# Patient Record
Sex: Male | Born: 1970 | Race: White | Hispanic: No | Marital: Married | Smoking: Never smoker
Health system: Southern US, Community
[De-identification: ages and names within clinical notes are randomized; demographics above are authoritative.]

## PROBLEM LIST (undated history)

## (undated) DIAGNOSIS — Z973 Presence of spectacles and contact lenses: Secondary | ICD-10-CM

## (undated) DIAGNOSIS — I4891 Unspecified atrial fibrillation: Secondary | ICD-10-CM

## (undated) DIAGNOSIS — Z95810 Presence of automatic (implantable) cardiac defibrillator: Secondary | ICD-10-CM

## (undated) DIAGNOSIS — I422 Other hypertrophic cardiomyopathy: Secondary | ICD-10-CM

## (undated) HISTORY — DX: Unspecified atrial fibrillation: I48.91

## (undated) HISTORY — DX: Other hypertrophic cardiomyopathy: I42.2

---

## 2006-04-08 ENCOUNTER — Ambulatory Visit: Payer: Self-pay | Admitting: Cardiology

## 2006-04-08 ENCOUNTER — Ambulatory Visit: Payer: Self-pay

## 2006-04-08 ENCOUNTER — Encounter: Payer: Self-pay | Admitting: Cardiology

## 2006-04-26 ENCOUNTER — Ambulatory Visit: Payer: Self-pay | Admitting: Cardiology

## 2006-05-02 ENCOUNTER — Ambulatory Visit (HOSPITAL_COMMUNITY): Admission: RE | Admit: 2006-05-02 | Discharge: 2006-05-02 | Payer: Self-pay | Admitting: Cardiology

## 2006-05-02 ENCOUNTER — Ambulatory Visit: Payer: Self-pay | Admitting: Internal Medicine

## 2006-05-08 ENCOUNTER — Ambulatory Visit: Payer: Self-pay | Admitting: Cardiology

## 2006-06-20 ENCOUNTER — Ambulatory Visit: Payer: Self-pay | Admitting: Orthopedic Surgery

## 2006-07-14 ENCOUNTER — Ambulatory Visit: Payer: Self-pay | Admitting: Orthopedic Surgery

## 2006-07-19 ENCOUNTER — Ambulatory Visit (HOSPITAL_COMMUNITY): Admission: RE | Admit: 2006-07-19 | Discharge: 2006-07-19 | Payer: Self-pay | Admitting: Orthopedic Surgery

## 2006-07-22 ENCOUNTER — Ambulatory Visit: Payer: Self-pay | Admitting: Orthopedic Surgery

## 2006-07-23 ENCOUNTER — Encounter (HOSPITAL_COMMUNITY): Admission: RE | Admit: 2006-07-23 | Discharge: 2006-08-22 | Payer: Self-pay | Admitting: Orthopedic Surgery

## 2006-08-05 ENCOUNTER — Ambulatory Visit: Payer: Self-pay | Admitting: Orthopedic Surgery

## 2006-12-06 ENCOUNTER — Ambulatory Visit (HOSPITAL_COMMUNITY): Admission: RE | Admit: 2006-12-06 | Discharge: 2006-12-06 | Payer: Self-pay | Admitting: Family Medicine

## 2007-01-29 ENCOUNTER — Ambulatory Visit: Payer: Self-pay | Admitting: Cardiology

## 2007-04-08 ENCOUNTER — Ambulatory Visit: Payer: Self-pay | Admitting: Cardiology

## 2007-10-29 IMAGING — CR DG CHEST 2V
2 series · 2 of 2 positions shown · non-contrast
Comparison: None.

Exam: Chest, 2 views.

HISTORY: Bronchitis. Febrile illness. Cough. Fever.

[view not recorded (1 of 2)]
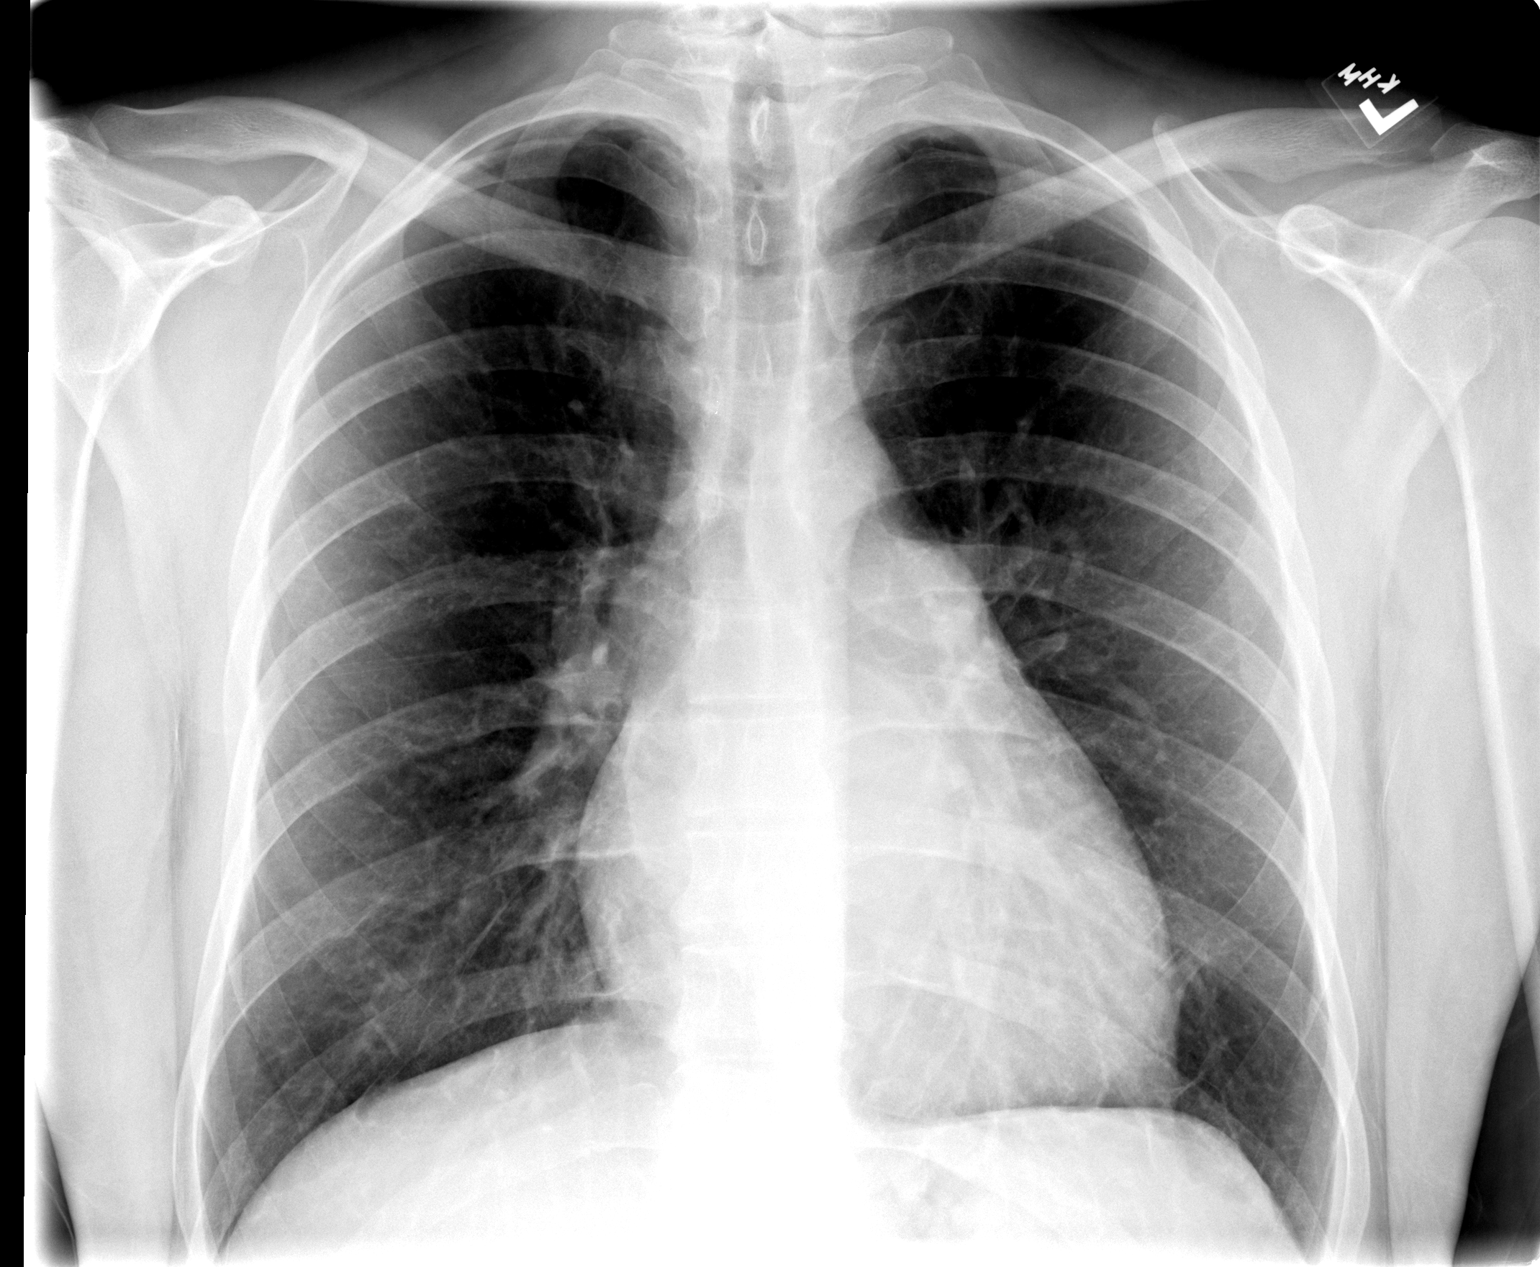

[view not recorded (2 of 2)]
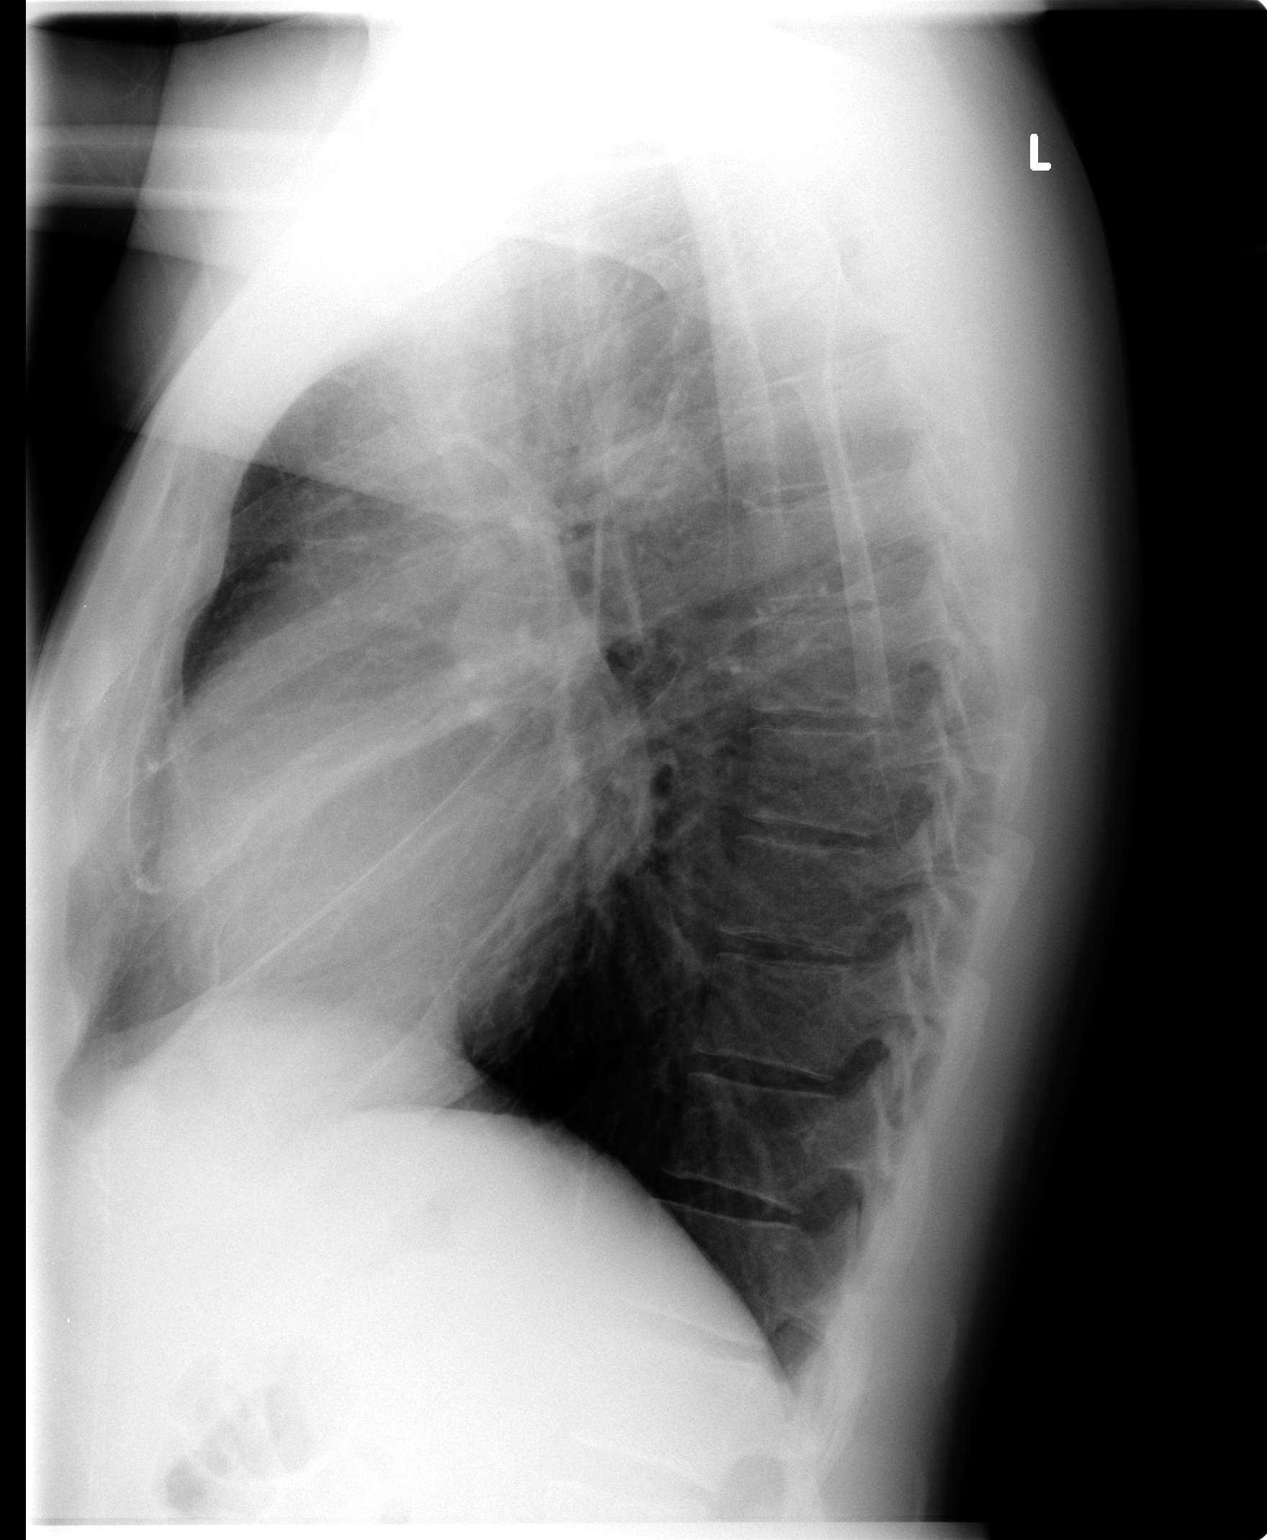

[2 of 2 positions shown; findings below may reference images not displayed]

FINDINGS: Heart size is mildly enlarged.

No pleural effusions or pulmonary edema.

No airspace opacities are identified.

Review of the visualized osseous structures is unremarkable.
IMPRESSION: 1. Mild cardiac enlargement without evidence for congestive heart failure.
2. No evidence for pneumonia.

## 2008-03-03 ENCOUNTER — Ambulatory Visit: Payer: Self-pay

## 2008-03-03 ENCOUNTER — Encounter: Payer: Self-pay | Admitting: Cardiology

## 2008-03-04 ENCOUNTER — Ambulatory Visit: Payer: Self-pay | Admitting: Cardiology

## 2008-06-09 ENCOUNTER — Ambulatory Visit: Payer: Self-pay | Admitting: Cardiology

## 2008-07-07 ENCOUNTER — Ambulatory Visit: Payer: Self-pay | Admitting: Cardiology

## 2008-07-27 ENCOUNTER — Ambulatory Visit: Payer: Self-pay | Admitting: Cardiology

## 2008-07-27 LAB — CONVERTED CEMR LAB
BUN: 23 mg/dL (ref 6–23)
CO2: 28 meq/L (ref 19–32)
Chloride: 108 meq/L (ref 96–112)
Creatinine, Ser: 0.9 mg/dL (ref 0.4–1.5)
Glucose, Bld: 99 mg/dL (ref 70–99)
Pro B Natriuretic peptide (BNP): 1528 pg/mL — ABNORMAL HIGH (ref 0.0–100.0)

## 2008-08-04 ENCOUNTER — Ambulatory Visit: Payer: Self-pay | Admitting: Cardiology

## 2008-08-04 LAB — CONVERTED CEMR LAB
BUN: 22 mg/dL (ref 6–23)
Calcium: 9.7 mg/dL (ref 8.4–10.5)
GFR calc Af Amer: 97 mL/min
Glucose, Bld: 72 mg/dL (ref 70–99)
HCT: 45.1 % (ref 39.0–52.0)
Hemoglobin: 15.2 g/dL (ref 13.0–17.0)
Monocytes Absolute: 0.7 10*3/uL (ref 0.1–1.0)
Monocytes Relative: 9.3 % (ref 3.0–12.0)
Neutro Abs: 5.3 10*3/uL (ref 1.4–7.7)
RDW: 13.1 % (ref 11.5–14.6)
Sodium: 142 meq/L (ref 135–145)

## 2008-08-23 DIAGNOSIS — I5032 Chronic diastolic (congestive) heart failure: Secondary | ICD-10-CM

## 2008-08-23 DIAGNOSIS — I421 Obstructive hypertrophic cardiomyopathy: Secondary | ICD-10-CM | POA: Insufficient documentation

## 2008-08-24 ENCOUNTER — Ambulatory Visit: Payer: Self-pay | Admitting: Cardiology

## 2008-08-24 ENCOUNTER — Ambulatory Visit: Payer: Self-pay

## 2008-08-24 ENCOUNTER — Encounter: Payer: Self-pay | Admitting: Cardiology

## 2008-09-03 HISTORY — PX: CARDIAC SURGERY: SHX584

## 2008-09-20 ENCOUNTER — Ambulatory Visit: Payer: Self-pay | Admitting: Cardiology

## 2008-10-11 ENCOUNTER — Encounter: Payer: Self-pay | Admitting: Cardiology

## 2008-10-28 ENCOUNTER — Ambulatory Visit: Payer: Self-pay | Admitting: Cardiology

## 2008-10-28 ENCOUNTER — Ambulatory Visit: Payer: Self-pay

## 2008-12-13 ENCOUNTER — Encounter: Payer: Self-pay | Admitting: Cardiology

## 2008-12-14 ENCOUNTER — Encounter: Payer: Self-pay | Admitting: Cardiology

## 2008-12-15 ENCOUNTER — Encounter: Payer: Self-pay | Admitting: Cardiology

## 2008-12-21 ENCOUNTER — Encounter: Payer: Self-pay | Admitting: Cardiology

## 2008-12-27 ENCOUNTER — Telehealth: Payer: Self-pay | Admitting: Cardiology

## 2008-12-28 DIAGNOSIS — J45909 Unspecified asthma, uncomplicated: Secondary | ICD-10-CM | POA: Insufficient documentation

## 2008-12-29 ENCOUNTER — Ambulatory Visit: Payer: Self-pay | Admitting: Cardiology

## 2008-12-29 DIAGNOSIS — I4891 Unspecified atrial fibrillation: Secondary | ICD-10-CM

## 2008-12-30 ENCOUNTER — Encounter: Payer: Self-pay | Admitting: Cardiology

## 2009-01-03 ENCOUNTER — Encounter (HOSPITAL_COMMUNITY): Admission: RE | Admit: 2009-01-03 | Discharge: 2009-02-02 | Payer: Self-pay | Admitting: Internal Medicine

## 2009-01-04 ENCOUNTER — Telehealth: Payer: Self-pay | Admitting: Cardiology

## 2009-01-05 ENCOUNTER — Telehealth: Payer: Self-pay | Admitting: Cardiology

## 2009-01-05 ENCOUNTER — Encounter: Payer: Self-pay | Admitting: Cardiology

## 2009-01-07 ENCOUNTER — Encounter: Payer: Self-pay | Admitting: Cardiology

## 2009-01-10 LAB — CONVERTED CEMR LAB
CO2: 23 meq/L (ref 19–32)
Chloride: 100 meq/L (ref 96–112)
Potassium: 4.7 meq/L (ref 3.5–5.3)
Sodium: 137 meq/L (ref 135–145)

## 2009-01-17 ENCOUNTER — Ambulatory Visit: Payer: Self-pay

## 2009-01-17 ENCOUNTER — Encounter: Payer: Self-pay | Admitting: Cardiology

## 2009-01-18 ENCOUNTER — Ambulatory Visit: Payer: Self-pay | Admitting: Cardiology

## 2009-02-02 ENCOUNTER — Encounter (HOSPITAL_COMMUNITY): Admission: RE | Admit: 2009-02-02 | Discharge: 2009-02-28 | Payer: Self-pay | Admitting: Cardiology

## 2009-02-09 ENCOUNTER — Encounter: Payer: Self-pay | Admitting: Cardiology

## 2009-02-28 ENCOUNTER — Encounter: Payer: Self-pay | Admitting: Cardiology

## 2009-03-03 ENCOUNTER — Encounter: Payer: Self-pay | Admitting: Cardiology

## 2009-03-03 ENCOUNTER — Ambulatory Visit: Payer: Self-pay

## 2009-03-03 ENCOUNTER — Ambulatory Visit: Payer: Self-pay | Admitting: Cardiology

## 2009-03-04 ENCOUNTER — Encounter (HOSPITAL_COMMUNITY): Admission: RE | Admit: 2009-03-04 | Discharge: 2009-04-03 | Payer: Self-pay | Admitting: Cardiology

## 2009-04-04 ENCOUNTER — Encounter (HOSPITAL_COMMUNITY): Admission: RE | Admit: 2009-04-04 | Discharge: 2009-05-04 | Payer: Self-pay | Admitting: Cardiology

## 2009-05-05 ENCOUNTER — Ambulatory Visit: Payer: Self-pay | Admitting: Cardiology

## 2009-05-12 ENCOUNTER — Ambulatory Visit: Payer: Self-pay

## 2009-06-02 ENCOUNTER — Encounter: Payer: Self-pay | Admitting: Cardiology

## 2009-11-02 ENCOUNTER — Ambulatory Visit: Payer: Self-pay | Admitting: Cardiology

## 2009-12-05 ENCOUNTER — Ambulatory Visit: Payer: Self-pay

## 2009-12-05 ENCOUNTER — Ambulatory Visit: Payer: Self-pay | Admitting: Internal Medicine

## 2009-12-05 ENCOUNTER — Ambulatory Visit (HOSPITAL_COMMUNITY): Admission: RE | Admit: 2009-12-05 | Discharge: 2009-12-05 | Payer: Self-pay | Admitting: Cardiology

## 2009-12-05 ENCOUNTER — Encounter: Payer: Self-pay | Admitting: Cardiology

## 2010-05-09 ENCOUNTER — Ambulatory Visit: Payer: Self-pay | Admitting: Cardiology

## 2010-10-01 LAB — CONVERTED CEMR LAB
Calcium: 10.5 mg/dL (ref 8.4–10.5)
MCHC: 32.3 g/dL (ref 30.0–36.0)
MCV: 94.8 fL (ref 78.0–100.0)
Platelets: 493 10*3/uL — ABNORMAL HIGH (ref 150–400)
RDW: 13.9 % (ref 11.5–15.5)
Sodium: 142 meq/L (ref 135–145)
WBC: 11.3 10*3/uL — ABNORMAL HIGH (ref 4.0–10.5)

## 2010-10-04 NOTE — Assessment & Plan Note (Signed)
Summary: 6 month rov.sl   Visit Type:  Follow-up Primary Win Guajardo:  Lubertha South  CC:  no complaints.  History of Present Illness: The patient is 40 years old and return for management of hypertrophic cardiomyopathy. About a year and a half ago he developed progressive symptoms from his hypertrophic cardiomyopathy with diastolic heart failure. In April of 2010 he underwent a myectomy at the [facility] with the reduction in his subvalvular gradient from 90 mm to 0 mm. He had a stress test following his myectomy procedure here in our office and had no drop in his blood pressure and no ventricular tachycardia. He did have some second degree AV block. He has left bundle branch block on his ECG since his myectomy.  He has done well recently and has had no chest pain shortness of breath or swelling or palpitations. He's gotten into a regular exercise program after school. He teaches chemistry in high school. He has gained some weight over the past several months despite his exercise.  Current Medications (verified): 1)  Aspirin 81 Mg Tbec (Aspirin) .... Take One Tablet By Mouth Daily 2)  Carvedilol 6.25 Mg Tabs (Carvedilol) .... Take Two Tablets in The Morning and One Tablet in The Evening.  Allergies (verified): 1)  ! * Latex  Past History:  Past Medical History: Reviewed history from 08/23/2008 and no changes required.  1. 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 2. Class II to III heart failure. 3. History of asthma.   Review of Systems       ROS is negative except as outlined in HPI.   Vital Signs:  Patient profile:   40 year old male Height:      79 inches Weight:      218 pounds BMI:     24.65 Pulse rate:   59 / minute BP sitting:   119 / 78  (left arm) Cuff size:   large  Vitals Entered By: Burnett Kanaris, CNA (May 09, 2010 4:31  PM)  Physical Exam  Additional Exam:  Gen. Well-nourished, in no distress   Neck: No JVD, thyroid not enlarged, no carotid bruits Lungs: No tachypnea, clear without rales, rhonchi or wheezes Cardiovascular: Rhythm regular, PMI not displaced,  heart sounds  normal, grade 2/6 systolic ejection murmur at the left sternal edge, no peripheral edema, pulses normal in all 4 extremities. Abdomen: BS normal, abdomen soft and non-tender without masses or organomegaly, no hepatosplenomegaly. 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 He appears euvolemic and his exercise tolerance appears to be very good. This problem appears stable. We will continue his current treatment. We will get a echocardiogram before his followup visit with Dr. Gala Romney in March of 2012.  His updated medication list for this problem includes:    Aspirin 81 Mg Tbec (Aspirin) .Marland Kitchen... Take one tablet by mouth daily    Carvedilol 6.25 Mg Tabs (Carvedilol) .Marland Kitchen... Take two tablets in the morning and one tablet in the evening.  Orders: Echocardiogram (Echo)  Problem # 2:  DIASTOLIC HEART FAILURE, CHRONIC (ICD-428.32) He appears euvolemic. He has had no signs of CHF since his myectomy.  His updated medication list for this problem includes:    Aspirin 81 Mg Tbec (Aspirin) .Marland Kitchen... Take one tablet by mouth daily    Carvedilol 6.25 Mg Tabs (Carvedilol) .Marland Kitchen... Take two tablets in the morning and one tablet in the evening.  Orders: Echocardiogram (Echo)  Problem # 3:  ATRIAL FIBRILLATION (ICD-427.31) He had atrial fibrillation in the early postoperative period but has none since. This problem appears to have resolved. His updated medication list for this problem includes:    Aspirin 81 Mg Tbec (Aspirin) .Marland Kitchen... Take one tablet by mouth daily    Carvedilol 6.25  Mg Tabs (Carvedilol) .Marland Kitchen... Take two tablets in the morning and one tablet in the evening.  Patient Instructions: 1)  Your physician wants you to follow-up in: March 2012 with Dr Nicholes Mango.   You will receive a reminder letter in the mail two months in advance. If you don't receive a letter, please call our office to schedule the follow-up appointment. 2)  Your physician has requested that you have an echocardiogram.  Echocardiography is a painless test that uses sound waves to create images of your heart. It provides your doctor with information about the size and shape of your heart and how well your heart's chambers and valves are working.  This procedure takes approximately one hour. There are no restrictions for this procedure. March  2012 a few days before the appointment with Dr Gala Romney.

## 2010-10-04 NOTE — Miscellaneous (Signed)
  Clinical Lists Changes  Observations: Added new observation of ECHOINTERP:  Study Conclusions            - Left ventricle: Small jet of continuous flow on LV side of       interventricular septum consistent with small intracavity fistula.       Present in echo from 2010. The cavity size was normal. Wall       thickness was increased in a pattern of moderate LVH. Systolic       function was vigorous. The estimated ejection fraction was in the       range of 65% to 70%. Doppler parameters are consistent with       abnormal left ventricular relaxation (grade 1 diastolic       dysfunction).     - Left atrium: The atrium was severely dilated.     - Right atrium: The atrium was moderately to severely dilated.     - Pulmonary arteries: PA peak pressure: 36mm Hg (S).     - Pericardium, extracardiac: A trivial pericardial effusion was       identified. (12/05/2009 9:29)      Echocardiogram  Procedure date:  12/05/2009  Findings:       Study Conclusions            - Left ventricle: Small jet of continuous flow on LV side of       interventricular septum consistent with small intracavity fistula.       Present in echo from 2010. The cavity size was normal. Wall       thickness was increased in a pattern of moderate LVH. Systolic       function was vigorous. The estimated ejection fraction was in the       range of 65% to 70%. Doppler parameters are consistent with       abnormal left ventricular relaxation (grade 1 diastolic       dysfunction).     - Left atrium: The atrium was severely dilated.     - Right atrium: The atrium was moderately to severely dilated.     - Pulmonary arteries: PA peak pressure: 36mm Hg (S).     - Pericardium, extracardiac: A trivial pericardial effusion was       identified.

## 2010-10-04 NOTE — Assessment & Plan Note (Signed)
Summary: f60m   Visit Type:  Follow-up Primary Provider:  Lubertha South  CC:  no complaints.  History of Present Illness: Patient is 40 years old and returns for management of hypertrophic cardiomyopathy. He has a long history of familial hypertrophic cardio myopathy. He underwent myectomy at the [facility] in April 2010. His valvular gradient went from 90 mm to 10 mm postoperatively. We did a treadmill test a few months after his surgery and he had no hypotension and no ventricular tachycardia. He did develop some AV block with exercise.  He has done well over the past number of months. He's been working out 5 days a week. 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 He does say that when he exercises he gets his heart rate up from 60-100 he does quite well. If he pushes himself further his heart rate goes up to 120s and he feels bad and had to stop. He's had no chest pain.  Current Medications (verified): 1)  Aspirin 81 Mg Tbec (Aspirin) .... Take One Tablet By Mouth Daily 2)  Carvedilol 6.25 Mg Tabs (Carvedilol) .... Take One Tablet By Mouth Twice A Day  Allergies (verified): 1)  ! * Latex  Past History:  Past Medical History: Reviewed history from 08/23/2008 and no changes required.  1. 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 2. Class II to III heart failure. 3. History of asthma.   Review of Systems       ROS is negative except as outlined in HPI.   Vital Signs:  Patient profile:   40 year old male Height:      79 inches Weight:      218 pounds BMI:     24.65 Pulse rate:   67 / minute BP sitting:   129 / 76  (left arm) Cuff size:   large  Vitals Entered By: Burnett Kanaris, CNA (November 02, 2009 4:31 PM)  Physical Exam  Additional Exam:  Gen. Well-nourished, in no distress   Neck: No JVD, thyroid not enlarged, no carotid  bruits Lungs: No tachypnea, clear without rales, rhonchi or wheezes Cardiovascular: Rhythm regular, PMI not displaced,  heart sounds  normal, grade 2/6 short systolic murmur at the left sternal edge, no peripheral edema, pulses normal in all 4 extremities. Abdomen: BS normal, abdomen soft and non-tender without masses or organomegaly, no hepatosplenomegaly. 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 He's done quite well since that time. He says that when he pushes his heart rate over 100 he starts to feel bad. We decided to increase his carvedilol from 6.25 b.i.d. to 2 tablets in the morning and one tablet in the afternoon. If his symptoms on the treadmill or not better he can increase this further to 2 tablets in the morning and 2 tablets in the evening as long as his resting pulse rate is over 55. We will also repeat his echocardiogram to see his response to surgery over the past year.   His updated medication list for this problem includes:    Aspirin 81 Mg Tbec (Aspirin) .Marland Kitchen... Take one  tablet by mouth daily    Carvedilol 6.25 Mg Tabs (Carvedilol) .Marland Kitchen... Take two tablets in the morning and one tablet in the evening.  Orders: Echocardiogram (Echo)  Problem # 2:  DIASTOLIC HEART FAILURE, CHRONIC (ICD-428.32) He appears compensated today and euvolemic. We will make the adjustments as outlined above. His updated medication list for this problem includes:    Aspirin 81 Mg Tbec (Aspirin) .Marland Kitchen... Take one tablet by mouth daily    Carvedilol 6.25 Mg Tabs (Carvedilol) .Marland Kitchen... Take two tablets in the morning and one tablet in the evening.  Problem # 3:  ASTHMA, UNSPECIFIED, UNSPECIFIED STATUS (ICD-493.90) This has been quiet and is managed by his primary care physician.  Patient  Instructions: 1)  Your physician has recommended you make the following change in your medication: 1) increase coreg 6.25mg  to two tabs in the morning and one tab in the evening. 2)  If your symptoms do not get any better, you may increase your coreg to 6.25mg  two tabs two times a day, if your resting heart rate is 55 beats per minute or above. 3)  Your physician has requested that you have an echocardiogram in 1 month.  Echocardiography is a painless test that uses sound waves to create images of your heart. It provides your doctor with information about the size and shape of your heart and how well your heart's chambers and valves are working.  This procedure takes approximately one hour. There are no restrictions for this procedure. 4)  Your physician wants you to follow-up in: 6 months.  You will receive a reminder letter in the mail two months in advance. If you don't receive a letter, please call our office to schedule the follow-up appointment. Prescriptions: CARVEDILOL 6.25 MG TABS (CARVEDILOL) Take two tablets in the morning and one tablet in the evening.  #90 x 6   Entered by:   Sherri Rad, RN, BSN   Authorized by:   Lenoria Farrier, MD, [facility]   Signed by:   Sherri Rad, RN, BSN on 11/02/2009   Method used:   Print then Give to Patient   RxID:   [id number]

## 2011-01-16 NOTE — Assessment & Plan Note (Signed)
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  His dad also has  hypertrophic cardiomyopathy.  We have evaluated him with an  echocardiogram which was performed in August, 2007 and showed systolic  anterior motion of the mitral valve with a peak outflow gradient of 117  mmHg and mid cavity obliteration.  His septal thickness was 17 mm, and  his posterior wall thickness was 16 mm.  He had a CPX test which showed  reduced exercise tolerance and reduced CO2 max, which was 66% of the  sedentary value predicted for his age.   When I saw him last time, we had put him on Cardizem.  We also  recommended regular exercise.  His wife brought him a treadmill, and he  was doing regular exercise for a while, but he started getting involved  in yard work and backed off this for a little bit.  For a period of  time, he was off his Cardizem.  He is back on it now.   He still has limited exercise tolerance.  He gets short of breath fairly  easily.   The other symptom he has is cough.  This occurs usually at night and is  slightly productive.  He attributed this to his cardiomyopathy.  He has  had no symptoms of reflux associated with this and no wheezing  associated with it.   His past medical history is significant for asthma and allergies.   His current medications include Cardizem CD 120.   PHYSICAL EXAMINATION:  VITAL SIGNS:  The blood pressure is 110/72.  The  pulse is 88 and regular.  There is no venous distention.  The carotid pulses were full without  bruits.  CHEST:  Clear.  CARDIAC:  Rhythm was regular.  There was a grade 3/6 harsh systolic  murmur at the left sternal edge  radiating to the base.  I could hear no  diastolic murmur.  ABDOMEN:  Soft without organomegaly.  EXTREMITIES:  Peripheral pulses were full.  There was no peripheral  edema.   IMPRESSION:  1. Hypertrophic cardiomyopathy.  2. Congestive heart failure, class II-III, euvolemic.  3. History of Mononucleosis.  4. History of asthma.   RECOMMENDATIONS:  Dajaun still has significant symptoms, some of which I  think are related to his cardiomyopathy.  I encouraged him to continue  doing regular exercise on the treadmill.  We will plan to increase his  Cardizem from 120 to 180 mg.  I doubt his cough is from his  cardiomyopathy and suspect it may be related to allergies and asthma or  possibly reflux with asthma.  If he is not better on a higher dose of  Cardizem, then he is to call us and we will consider pulmonary  consultation.  I will plan to see  him back in three months.  I told him  once he is in a more regular exercise program and once he is on a higher  dose of Cardizem, we will want to consider redoing a standard stress  test as well as repeating his echo.     Bruce Elvera Lennox Juanda Chance, MD, [facility]  Electronically Signed    BRB/MedQ  DD: 04/08/2007  DT: 04/08/2007  Job #: [id number]

## 2011-01-16 NOTE — Assessment & Plan Note (Signed)
[facility]                            CARDIOLOGY OFFICE NOTE   ERIKSON, DANZY                     MRN:          [id number]  DATE:03/04/2008                            DOB:          06/29/1971    PRIMARY CARE PHYSICIAN:  None.   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  He has a strong family history of  hypertrophic cardiomyopathy.  His dad was a patient of mine and died of  his myopathy.  He has a cousin who has had myectomy and has an ICD.   Casey Holland says he has become more symptomatic over the last several months.  He says he gives out very easily with minimal symptoms.  He also has  some mild chest pain with activity.  He says these symptoms are also  related to after eating a heavy meal.   We did an echo on him prior to this visit and his gradient was 80  compared to 117 on his previous echo.  His posterior wall and septal  thickness were 15 and 17, which were not much different than before.   PAST MEDICAL HISTORY:  Significant for asthma and allergies.   CURRENT MEDICATIONS:  Cardizem CD 180, but he indicated that he has not  been taking this regularly until the last few weeks.   He had an inversion to medicines for some time, but he says his attitude  has changed now.   PHYSICAL EXAMINATION:  VITAL SIGNS:  Blood pressure 118/82 and pulse 91  and regular.  NECK:  There was no venous distension.  The carotid pulses were full  without bruits.  CHEST:  Clear.  CARDIAC:  Rhythm is regular with grade 3/6 harsh systolic murmur at the  left sternal edge radiating to the base.  There was a short diastolic  murmur heard at the apex.  ABDOMEN:  Soft.  Normal bowel sounds.  There is no peripheral edema.   IMPRESSION:  1. Familial hypertrophic cardiomyopathy.  2. Congestive heart failure class II-III, euvolemic.  3.  History of asthma.   RECOMMENDATIONS:  Cadan is still quite symptomatic from his hypertrophic  cardiomyopathy.  He has not really been given an adequate trial of  medications, but he says he is willing to take his medications regularly  now.  We will increase his Cardizem from 180-240.  I will see him back  in about 3 months.  Beta-blockers could be somewhat problematic in him  because of his asthma, but we could probably use beta-blockers, at least  at lower doses.  He has read on the  Internet and is aware of lot of the implications of IHSS and the  possibilities of treatment with myectomy or septal ablation and  sometimes need for pacemaker and ICD.     Bruce Elvera Lennox Juanda Chance, MD, [facility]  Electronically Signed    BRB/MedQ  DD: 03/04/2008  DT: 03/05/2008  Job #: [id number]

## 2011-01-16 NOTE — Assessment & Plan Note (Signed)
[facility]                            CARDIOLOGY OFFICE NOTE   Casey Holland, Casey Holland                     MRN:          [id number]  DATE:07/07/2008                            DOB:          1970-11-16    The patient was seen in [facility] on July 07, 2008, for Dr.  Clifton James.   PRIMARY CARDIOLOGIST:  Everardo Beals. Juanda Chance, MD, [facility]   This is a 40 year old married white male patient of Dr. Charlies Constable who  has a history of a hypertrophic cardiomyopathy.  He saw Dr. Juanda Chance  earlier this month with complaints of exertional shortness of breath  with minimal activity and palpitations.  The patient had not been  completely compliant with his medicines and Dr. Juanda Chance urged him to take  his Cardizem daily and added low-dose beta-blocker 50 mg daily.  He is  here today for followup.  The patient states that he still becomes short  of breath and heart races if he does very minimal activity.  He states  he has gotten quite depressed over the past month since options were  given to him for possible myectomy in the future.  He does not know if  the depression is from the beta-blocker or from trying to cope with his  diagnosis.  The patient states he also gets quite dizzy when he gets up  quickly, but he has been able to manage this by rising slowly.  Dr.  Juanda Chance was hoping to get his baseline heart rate down from 50-60 range  and hopes of controlling his symptoms better.   CURRENT MEDICATIONS:  1. Cardizem CD 240 mg daily.  2. Toprol-XL 50 mg daily.   PHYSICAL EXAMINATION:  GENERAL:  This is a pleasant, but anxious 37-year-  old white male in no acute distress.  VITAL SIGNS:  Blood pressure 104/72 and pulse 68.  We did do orthostatic  blood pressures and he dropped mostly from lying to sitting from 115  down to 107.  Pulse remained in the 60s.  He was feeling slightly dizzy  when he rose.  NECK:  Without JVD, HJR bruit, or thyroid enlargement.  LUNGS:   Clear; anterior, posterior, and lateral.  HEART:  Regular rate and rhythm at 68 beats per minute.  Normal S1-S2  with a 2-3/6 systolic murmur at the left sternal border and apex.  ABDOMEN:  Soft without organomegaly, masses, lesions, or abnormal  tenderness.  EXTREMITIES:  Without cyanosis or clubbing edema.  He has good distal  pulses.   IMPRESSION:  1. Familial hypertrophic cardiomyopathy with documented subaortic      valve gradient of 80 mmHg by echocardiography.  2. Class II-III heart failure.  3. History of asthma.   PLAN:  We will increase his Toprol to 50 mg in the morning, 25 in the  evening, and keep his Cardizem the same.  I told him that he is to call  us if he does not tolerate these changes.  He  already has an appointment to see Dr. Juanda Chance back in December, at which  time, he will decide whether or  not a regular stress test or CPX testing  is deemed necessary.      Jacolyn Reedy, PA-C  Electronically Signed      Verne Carrow, MD  Electronically Signed   ML/MedQ  DD: 07/07/2008  DT: 07/07/2008  Job #: [id number]

## 2011-01-16 NOTE — Assessment & Plan Note (Signed)
[facility] HEALTHCARE                            CARDIOLOGY OFFICE NOTE   COULTER, OLDAKER                     MRN:          [id number]  DATE:01/29/2007                            DOB:          08-28-1971    PRIMARY CARE PHYSICIAN:  Casey Holland, M.D.   CLINICAL HISTORY:  Casey Holland has hypertrophic cardiomyopathy and  returns for a followup today.  I had seen him last fall, and we had done  a CPX test on him and a recent echo.  CPX test showed reduced VO2 max  which was 25 ml/kg/minute which was 66% of predicted age (sedentary).  He also had an echocardiogram which showed a peak gradient of  and septal and posterior wall thickness of 16 mm.   We had started him on Cardizem at that time, but he stopped it thinking  he had some side effects from wheezing.  He subsequently developed  mononucleosis and had fevers and weakness and was treated by Dr. Gerda Holland.  He is now about over that, and he started back on his Cardizem about a  month ago.  Dr. Gerda Holland did a BNP level which was about 500, and arranged  for him to come back here for a followup.  He had been previously  scheduled to come back earlier than this.   He has 2 children who are 18 months and 4 years.  His wife was with him  today.  He has not been very active yet.  He does activities around the  house and occasionally mows the lawn, now, but he has not done any  regular physical activity.  They did just buy a treadmill so he is  planning to do from this point forward.   PAST MEDICAL HISTORY:  Significant for asthma and allergies.   CURRENT MEDICATIONS:  Cardizem CD 120 mg daily.   PHYSICAL EXAMINATION:  On examination, the blood pressure is 113/75 and  pulse is 72 and regular.  There was no venous distension.  The carotid pulses were full without  bruits.  Chest was clear.  Cardiac rhythm was regular.  There was a harsh grade 3/6 systolic  ejection murmur at the left sternal  edge radiating to the base.  There  was 3rd sound in early diastolic.  I could hear no diastolic murmur.  The abdomen was soft with normal bowel sounds.  There is no  hepatosplenomegaly.  Peripheral pulses were full and there was no peripheral edema.   IMPRESSION:  1. Hypertrophic cardiomyopathy with decreased exercise tolerance.  2. Recent mononucleosis.  3. History of asthma and allergies.   RECOMMENDATIONS:  Casey Holland's exercise tolerance is reduced, and it is  difficult to know how much of this is related to his recent  mononucleosis and deconditioning, and how much is related to his cardiac  problem.  I encouraged him to gradually get involved in an exercise  program to try and improve his conditioning.  I recommend that he do 30  minutes of aerobic activity a day, whether it is walking, walking on the  treadmill or biking.  Will  continue his Cardizem at the same dose.  Will  plan to see him back in 3 months.  At that time, we will consider  adjusting his Cardizem dose and decide about further changes.  At some  Point, we will want to put him back on the treadmill to see what his  exercise tolerance is and if it is improved on the Cardizem.  Beta  blockers are also a problem in the future.  Casey Holland has quite a bit of  concern, both about medications and about the surgery, but he seems  comfortable with the use of Cardizem at the present.     Casey Elvera Lennox Juanda Chance, MD, [facility]  Electronically Signed    BRB/MedQ  DD: 01/29/2007  DT: 01/29/2007  Job #: [id number]   cc:   Casey Holland, M.D.

## 2011-01-16 NOTE — Letter (Signed)
September 20, 2008    Dr. Wilfred Lacy  [facility]  [address] [place], [place] [address]   RE:  QUINDARRIUS, JOPLIN  MRN:  [id number]  /  DOB:  12/12/1970   Dear Cyndra Numbers,   I would very much appreciate your seeing Mr. Osbaldo Mark in  consultation for evaluation and management of his hypertrophic  cardiomyopathy.  Mr. Corbo is 40 years old and has a long history of  familial hypertrophic cardiomyopathy.  Over the last year, he has become  increasingly symptomatic with shortness of breath with exertion and  symptoms of dizziness.  He developed some volume overload/congestive  heart failure and has required treatment with Lasix.  He is currently on  both beta-blocker and calcium channel blocker, but his symptoms did not  really improve much.  He gets short of breath with rather minimal  activity such as walking up stairs or herding on a level ground.   His past medical history is significant for history of asthma.   His current medications include Cardizem CD 300 mg daily, Coreg 12.5 mg  b.i.d., and Lasix 20 mg daily.   His family history is markedly positive for hypertrophic cardiomyopathy.  His father had hypertrophic cardiomyopathy and had valve surgery at [facility]  and died several years ago.  His father's sister's son who is Mr.  Broz cousin had a myectomy at your institution about 8 years ago.  His name is Dione Booze.  Ms. Autumn Messing son also has hypertrophic  cardiomyopathy.  Mr. Vosler also indicated that his grandmother on  his father's side also had hypertrophic cardiomyopathy.   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  He  does not smoke.   PHYSICAL EXAMINATION:  On his examination today, the blood pressure was  110/72 and pulse 67 and regular.  He had some dizziness, but there was  only a slight drop in his blood pressure down to 98 systolic with  standing.  The venous pulsation was visible just at the clavicle.  The  carotid pulses were brisk.  The chest was clear without rales, rhonchi,  or wheezes.  The cardiac rhythm was regular.  There is a harsh 2-3/6  systolic murmur at the left sternal edge radiating to the base.  I could  hear no diastolic murmur.  The abdomen was soft with normal bowel  sounds.  There was no hepatosplenomegaly.  There was trace peripheral  edema and the pedal pulses were equal.   His electrocardiograms have shown left axis deviation in a pseudo  inferior infarct pattern and increased voltage.  His last  echocardiogram, which was performed on August 24, 2008, showed a peak  velocity of 5.5 meters per second with a peak gradient of 104 mmHg.  The  septum and posterior wall thickness were both 19 nanometers.  There was  mild-to-moderate mitral regurgitation, and there was moderate-to-  markedly dilated left atrium.  Remus also had a CTX exercise test  performed and we will get those results to you.   In summary, Wymon has hypertrophic cardiomyopathy with severe left  ventricular outflow tract obstruction and symptoms of class III heart  failure that had been refractory to medical therapy.   We certainly appreciate your evaluation and feel you should have  consideration for a surgical myectomy.   I will be in touch with you by e-mail to help arrange the consultation.    Sincerely,      Bruce R. Juanda Chance,  MD, [facility]  Electronically Signed    BRB/MedQ  DD: 09/20/2008  DT: 09/21/2008  Job #: [id number]

## 2011-01-16 NOTE — Assessment & Plan Note (Signed)
[facility] HEALTHCARE                            CARDIOLOGY OFFICE NOTE   SHYHIEM, BEENEY                     MRN:          [id number]  DATE:08/24/2008                            DOB:          April 21, 1971    CLINICAL HISTORY:  Jazziel returned for followup management of his  hypertrophic cardiomyopathy.  He has a long history of hypertrophic  cardiomyopathy and has been more symptomatic recently with symptoms of  shortness of breath with exertion.  We have been trying to titrate his  beta blockers and calcium-channel blockers, but he seem to get worse on  the beta-blockers, and he was seen in the office by Hillis Range with  increasing edema and thought to have a congestive heart failure.  He  started him on Lasix and this has improved his symptoms some, but he  still has shortness of breath with minimal exertion.   PAST MEDICAL HISTORY:  Significant for asthma.   CURRENT MEDICATIONS:  Cardizem 300 mg daily and Coreg 12.5 mg b.i.d.   On examination, the blood pressure is 100/67, the pulse 64 and regular.  There is no venous distention.  Carotid pulses were full without bruits.  Carotid pulses were brisk without bruits.  The chest was clear.  Heart  rhythm was regular.  There is a harsh 3/6 systolic murmur at the left  sternal edge.  I could hear no diastolic murmur.  The abdomen is soft  with normal bowel sounds.  Peripheral pulses were full with no  peripheral edema.   IMPRESSION:  1. Familial hypertrophic cardiomyopathy with subaortic valve gradient      of 80-100 mmHg.  2. Class III congestive heart failure secondary to familial      hypertrophic cardiomyopathy with subaortic valve gradient of 80-100      mmHg.  3. History of asthma.   RECOMMENDATIONS:  We had a great deal of difficulty titrating choice  medicines due to intolerance.  I am dictating this office note late and  I am not sure if we made an attempt to adjust his medications on the  day  of the office visit.  We did discuss a referral to a center and  discussed the possibilities of [address], [address], and [employer] [facility].  I told him I would do some research to see what we thought  the most appropriate place to go would be.  I think we arranged a  followup visit in about 4 weeks for these discussions.     Bruce Elvera Lennox Juanda Chance, MD, [facility]  Electronically Signed    BRB/MedQ  DD: 09/07/2008  DT: 09/08/2008  Job #: [id number]

## 2011-01-16 NOTE — Assessment & Plan Note (Signed)
[place] [facility]CARE                            CARDIOLOGY OFFICE NOTE   Casey Holland, Casey Holland                     MRN:          [id number]  DATE:07/27/2008                            DOB:          [birth year]    INTRODUCTION:  Mr. Casey Holland is a pleasant 40 year old gentleman with  hypertrophic cardiomyopathy, who presents today for an acute visit due  to worsening heart failure.   PROBLEM LIST:  1. Hypertrophic cardiomyopathy.  2. Chronic New York Heart Association Class II/III diastolic heart      failure.  3. History of asthma.   MEDICATIONS:  1. Cardizem 240 mg daily.  2. Toprol-XL 50 mg q.a.m. and 25 mg q.p.m.   Most recent echocardiogram performed on March 03, 2008, revealed [facility] of  the mitral valve with a left ventricular outflow tract gradient of 80  mmHg.  The left ventricle was small with vigorous contraction, and an  ejection fraction of 75%.  Mid-systolic closure of the aortic valve and  mild mitral regurgitation were observed.  The left atrium was dilated.  The interventricular septum measured 15 mm with posterior wall of 17 mm.   INTERVAL HISTORY:  Casey Holland presents today for followup.  He  reports progressive symptoms of heart failure since initiation of Toprol-  XL.  He reports that prior to initiating this medication, he became  dyspneic with 1 flight of stairs.  Since initiating this medication, he  now becomes dyspneic with approximately 1-1/2 flight of stairs.  He  denies significant chest discomfort, palpitations, presyncope, or  syncope.  He denies neurologic symptoms or other complaints today.  Most  recently, he was evaluated on July 07, 2008, and his Toprol-XL was  increased from 50 to 75 mg daily.  Since that time, he reports profound  worsening of his heart failure symptoms with moderate dyspnea with  minimal activities.  He has also had increasing abdominal girth with  fullness and early satiety.  Over the past 2-3  days, he has noticed  profound lower extremity edema.  He therefore presents today for an  acute visit for further evaluation.   PHYSICAL EXAMINATION:  VITAL SIGNS:  Blood pressure 120/76, heart rate  63, respirations 18, and weight 207 pounds.  GENERAL:  The patient is a well-appearing male in no acute distress.  He  is alert and oriented x3.  HEENT:  Normocephalic and atraumatic.  Sclerae clear.  Conjunctivae  pink.  Oropharynx clear.  NECK:  Supple.  JVP 10 cm.  LUNGS:  Clear to auscultation bilaterally.  HEART:  Regular rate and rhythm.  A 3/6 systolic ejection murmur along  the left sternal border with a 2/6 systolic ejection murmur at the apex.  GI:  Soft, nontender, nondistended.  Positive bowel sounds.  EXTREMITIES:  No clubbing or cyanosis.  A 2+ pitting ankle edema is  noted.  NEUROLOGIC:  Cranial nerves II through XII are intact.  Strength and  sensation are intact.  SKIN:  No ecchymosis or lacerations.  MUSCULOSKELETAL:  No deformity or atrophy.  PSYCHIATRIC:  Euthymic mood.  Full affect.   EKG today  reviewed by me reveals sinus rhythm at 63 beats per minute  with left atrial enlargement and a first-degree AV block measuring 218  msec.  Left ventricular hypertrophy is also noted as well as an inferior  infarct pattern.   SOCIAL HISTORY:  The patient continues to teach high school Chemistry.  He denies any recent increase in salt intake or change in diet.   FAMILY HISTORY:  The patient has a familial hypertrophic cardiomyopathy.   IMPRESSION:  Casey Holland is a 40 year old gentleman, well known to our  practice with familial hypertrophic cardiomyopathy and documented  subaortic valve gradient of 80 mmHg.  He now presents with acute on  chronic diastolic dysfunction, which the patient feels has been  precipitated by recent increases in Toprol.  Therapeutic strategies for  management of Casey Holland heart failure are quite difficult.  Typically, I would expect that  increasing his beta-blocker would have  been productive and would have decreased his heart failure symptoms.  However, he is quite clear that his symptoms have worsened with beta-  blockade.  I will therefore gently reduce his Toprol-XL back to 50 mg  daily.  I have also initiated Lasix 20 mg daily.  The patient is  instructed to continue daily weights and salt  restrictions.  We will  obtain a basic metabolic profile today to evaluate his renal function  and potassium.  He will need close followup with Dr. Juanda Chance.  I suspect  that if the patient's symptoms persist that surgical consultation may be  necessary in the near future.   PLAN:  1. We will obtain a BMP and BNP today.  2. Lasix 20 mg daily.  3. Decreased Toprol-XL to 50 mg daily.  4. The patient is instructed to follow up with Dr. Juanda Chance at his next      available appointment.     Hillis Range, MD  Electronically Signed    JA/MedQ  DD: 07/27/2008  DT: 07/28/2008  Job #: [id number]

## 2011-01-16 NOTE — Assessment & Plan Note (Signed)
[facility]                            CARDIOLOGY OFFICE NOTE   Casey Holland, Casey Holland                     MRN:          [id number]  DATE:08/04/2008                            DOB:          [birth year]    PRIMARY CARE PHYSICIAN:  None.   CLINICAL HISTORY:  Casey Holland returned for followup management of his  hypertrophic cardiomyopathy.  Casey Holland has a long history of cardiomyopathy  and has had increased symptoms of shortness of breath with exertion  recently, and I have been titrating his beta blocker and Cardizem.  He  has a resting gradient of about 80 mmHg and has a septal thickness of 17  mm.  He recently developed increased swelling and shortness of breath  and was seen by Dr. Hillis Range in my absence and felt to be in  congestive heart failure with volume overload for the first time.  He  started him on Lasix 20 mg a day and cut back his Toprol from 75 back to  50 mg.  He has done better since that time.  He feels the fluid is  pretty much cleared out and shortness of breath is some better although  he does get short of breath with exertion.   PAST MEDICAL HISTORY:  Significant for asthma.   CURRENT MEDICATIONS:  1. Cardizem CD 240 mg daily.  2. Toprol 50 mg daily.  3. Lasix 20 mg daily.   PHYSICAL EXAMINATION:  VITAL SIGNS:  Today, blood pressure is 113/79,  pulse 67 and regular.  NECK:  There was no venous distention.  The carotid pulses were full and  brisk.  CHEST:  Clear without rales or rhonchi.  CARDIAC:  Rhythm was regular.  There was grade 2-3/6 systolic ejection  murmur at the left sternal edge.  There is an opening sound at the apex  after S2.  ABDOMEN:  Soft with normal bowel sounds.  There is no  hepatosplenomegaly.  EXTREMITIES:  There was trace peripheral edema.  Pedal pulses were  equal.   IMPRESSION:  1. Familial hypertrophic cardiomyopathy with subaortic valve gradient      of 80 mmHg with recent congestive heart failure  and volume      overload, now improved with diuretics.  2. Class II to III heart failure.  3. History of asthma.   RECOMMENDATIONS:  Zarin is doing some better.  He will plan to continue  his beta blocker and Lasix and will increase his Cardizem from 240-300 a  day in the hope that we can reduce his gradient.  I will arrange for him  to have an echocardiogram before a followup visit in about 3 weeks.  I  discussed the situation with Dr. Graciela Husbands today, and he does not have  criteria for an ICD (3-cm septum, decreased blood pressure with exercise  testing, ventricular tachycardia, or a family history of sudden cardiac  death).  However, with persistent symptoms and congestive heart failure  despite treatment with beta blocker and Cardizem, it may be time to  refer him to [facility] for consideration for either alcohol  septal ablation  or surgical myectomy.  I will discuss this further with  him on his next visit.   We also got a chest x-ray today.     Bruce Elvera Lennox Juanda Chance, MD, [facility]  Electronically Signed    BRB/MedQ  DD: 08/04/2008  DT: 08/05/2008  Job #: [id number]

## 2011-01-16 NOTE — Assessment & Plan Note (Signed)
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, who returned for management of his hypertrophic  cardiomyopathy.  He has been quite symptomatic with shortness of breath  with exertion when he mows his lawn.  Other activities also cause him to  be short of breath with minimal activity.  He has had no chest pain or  palpitations.  In addition to these symptoms, he stated after he eats a  heavy meal, he develops a cough and until he produces some sputum, the  cough continues.  He thinks this is weight dependent.  If he gets his  weight around 200 pounds, that these symptoms will abate.   He has had a previous echocardiogram that showed subaortic valve  gradient of 80 mmHg and he has septal and posterior wall thicknesses of  15 and 17 mm.   PAST MEDICAL HISTORY:  Significant for asthma and allergies.   CURRENT MEDICATIONS:  Cardizem CD 240, which we increased at his last  visit.   PHYSICAL EXAMINATION:  VITAL SIGNS:  The blood pressure was 114/70 and  the pulse 81 and regular.  Weight was 204.  NECK:  There was no venous tension.  The carotid pulses were full.  CHEST:  Clear without rales or rhonchi.  HEART:  Rhythm was regular.  There was a grade 2-3/6 systolic ejection  murmur at the left sternal edge radiating to the base.  There was also a strain in early diastole.  ABDOMEN:  Soft with normal bowel sounds.  EXTREMITIES:  Peripheral pulses were full with no peripheral edema.   An electrocardiogram showed LVH, pseudo inferior infarction, and left  axis deviation.   IMPRESSION:  1. Familial hypertrophic cardiomyopathy with documented subaortic      valve gradient of 80 mmHg by echocardiography.  2. Class  II-III heart failure.  3. History of asthma.   RECOMMENDATIONS:  Casey Holland continues to be quite symptomatic with his  hypertrophic cardiomyopathy.  We planned to add beta-blocker, and I will  start him on Toprol-XL 50 mg a day to try and get better control of  these symptoms.  I would like to titrate his beta-blocker until we get  his resting heart rate at 50.  I will arrange for him to see Ms.  Casey Holland back in about 4 weeks to titrate his medicines and I will  see him back in 2 months.  Once we get his resting heart rate down to 60  and until he have postoperative medical therapy, we will reassess his  symptoms.  If we are not satisfied with his symptoms, then we will  document his limitations with  either a regular stress ECG or a CPX test.  If we document severe  limitations then I will consider referral for possible septal ablation  or myectomy.  Bruce Elvera Lennox Juanda Chance, MD, [facility]  Electronically Signed    BRB/MedQ  DD: 06/09/2008  DT: 06/10/2008  Job #: [id number]

## 2011-01-19 NOTE — Assessment & Plan Note (Signed)
[facility] HEALTHCARE                              CARDIOLOGY OFFICE NOTE   EMANUELL, MORINA                 MRN:          [id number]  DATE:04/26/2006                            DOB:          1970/12/10    Javonta returns for a follow-up visit for management of hypertrophic  cardiomyopathy.  He has had some increased shortness of breath with exertion  recently and also has had some symptoms of asthma.  We did an  echocardiogram, and this showed that his LV thickness had not changed much  (his septum was 17 mm and his posterior wall was 16 mm), but he developed an  increasing outflow gradient with a peak gradient of 117 mmHg.  We started  him on Cardizem CD 120 mg and scheduled him for a CPX, but the CPX had to be  changed and he has not had that done yet.   His symptoms did not change except that he has had some head congestion and  some mild productive cough.   PHYSICAL EXAMINATION:  VITAL SIGNS:  On examination, the blood pressure was  116/80 and the pulse 85 and regular.  NECK:  There was no venous distention.  The carotid pulses were full without  bruits.  CHEST:  Clear.  CARDIAC:  Cardiac rhythm was regular with a 2-3/6 systolic ejection murmur  at the left sternal edge radiating to the base.  I could hear no diastolic  murmur.  ABDOMEN:  Soft, normal bowel sounds present.  EXTREMITIES:  No peripheral edema.   IMPRESSION:  1. Hypertrophic cardiomyopathy with increasing subaortic valve gradient.  2. Symptoms of dyspnea and decreased exercise tolerance, probably related      to #1, versus asthma.  3. History of allergies, wheezing and asthma.   RECOMMENDATIONS:  I will plan to continue the same medications.  We will  give him a course of Clarinex and a Z-Pak for his probable bronchitis.  He  is rescheduled for CPX in one week and I will see him back to discuss the  results in a couple of weeks.   ADDENDUM:  He indicated that Dr. Gerda Diss has  postponed further management of  his pulmonary disease until we define the problem a little bit better with  CPX testing.                               Bruce Elvera Lennox Juanda Chance, MD, [facility]    BRB/MedQ  DD:  04/26/2006  DT:  04/26/2006  Job #:  [id number]

## 2011-01-19 NOTE — Op Note (Signed)
Casey Holland, Casey Holland NO.:  [id number]   MEDICAL RECORD NO.:  [id number]          PATIENT TYPE:  AMB   LOCATION:  DAY                           FACILITY:  [facility]   PHYSICIAN:  Vickki Hearing, M.D.DATE OF BIRTH:  1971/01/19   DATE OF PROCEDURE:  07/19/2006  DATE OF DISCHARGE:                                 OPERATIVE REPORT   HISTORY:  This is a 40 year old male who presents with approximately 1 year  of right knee pain.  He was evaluated in [place] with physical exam and  MRI, which showed he had a medial meniscal tear.  He wanted to be treated  closer to his home and his care was transferred from Dr. Dorene Grebe to me.  His symptoms included pain which were worsened by lifting, walking, shifting  his weight, twisting and turning; they were improved by rest.  After  informed consent process was completed, we scheduled the patient for a right  knee arthroscopy to take out part of his medial meniscus.   PREOPERATIVE DIAGNOSIS:  Medial meniscal tear of the right knee.   POSTOPERATIVE DIAGNOSIS:  Medial meniscal tear of the right knee.   PROCEDURE:  Arthroscopy of the right knee with partial medial meniscectomy.   SURGEON:  Vickki Hearing. M.D.   ANESTHETIC:  Spinal.   OPERATIVE FINDINGS:  There was a complex tear of the medial meniscus with  multiple fragments.  The medial compartment was otherwise normal.  ACL was  intact, Lateral compartment also normal, patellofemoral joint pristine.   DESCRIPTION OF PROCEDURE:  The procedure was done as follows:  The patient  was identified in the preop holding area as Casey Holland.  He marked the  right knee as the surgical site. I countersigned the site and updated his  history and physical.  He was started on an antibiotic and taken to the  operating room for a spinal anesthetic.  He was placed in the supine  position.  A lateral post was set up lateral to his right knee.  His right  knee was then prepped  with DuraPrep and draped sterilely.  We then took a  time-out to confirm the procedure as stated above.   Through a lateral portal, a diagnostic arthroscopy was performed.  He had  torn medial meniscus in the medial compartment with normal articular  surfaces.  ACL again was intact.  Lateral compartment was intact as well  with good articular surfaces and normal lateral meniscus and popliteal  tendon.   Patellofemoral joint was normal.   A medial portal was established as a working portal.  A shaver was placed in  the joint and portions of the meniscus were resected.  A straight duckbill  forceps was then used to trim the inferior leaf and the meniscus was further  balanced with a motorized shaver.   A probe was then used to palpate the remaining meniscal rim, which was  stable, the ACL, which was normal, and the lateral meniscus.   The knee was irrigated, suctioned dry and closed with Steri-Strips.  We  injected 30 mL of  Marcaine at the beginning of the case and also at the end  of the case. Sterile dressings and a Cryo Cuff were applied.  The patient  was taken to the recovery room in stable condition.   POSTOPERATIVE PLAN:  He can be weightbearing as tolerated, use Cryo Cuff for  the next 72 hours.  He is discharged on Lorcet and ibuprofen.  Physical  therapy can start on Tuesday.  I will see him on Monday for checkup.      Vickki Hearing, M.D.  Electronically Signed     SEH/MEDQ  D:  07/19/2006  T:  07/19/2006  Job:  [id number]

## 2011-01-19 NOTE — Letter (Signed)
October 07, 2008     RE:  [name], [name]  MRN:  [id number]  /  DOB:  02-07-1971   To Whom It May Concern,   Mr. Casey Holland has a cardiac condition and has been under our care.  He is scheduled to have surgery on November 15, 2008, and will be disabled  from work for 8-10 weeks following the surgery.  I will be happy to  answer further questions regarding his medical condition.    Sincerely,      Bruce R. Juanda Chance, MD, [facility]  Electronically Signed    BRB/MedQ  DD: 10/07/2008  DT: 10/08/2008  Job #: [id number]

## 2011-01-19 NOTE — H&P (Signed)
NAMECALBERT, HULSEBUS NO.:  [id number]   MEDICAL RECORD NO.:  [id number]          PATIENT TYPE:  AMB   LOCATION:  DAY                           FACILITY:  [facility]   PHYSICIAN:  Vickki Hearing, M.D.DATE OF BIRTH:  October 17, 1970   DATE OF ADMISSION:  DATE OF DISCHARGE:  LH                                HISTORY & PHYSICAL   CHIEF COMPLAINT:  Pain right knee.   HISTORY:  This is a 40 year old male who presented with right knee pain for  less than a year.  He complained of medial pain at times grade 6/10 but  usually 1/10.  His symptoms were worsened by lifting, walking. shifting his  weight, twisting, turning.  Improved with the rest.  He did not take any  medications, have therapy or use braces or have any injections.  He  presented with an MRI which showed a medial meniscal tear.  The MRI was from  [facility] and is dated 04/16/2006.  It was ordered by Dr. Dorene Grebe.  Impression:  Complex tear of medial meniscus extending from central  aspect of posterior horn to mid body.   Apparently the patient wants his care transferred to [facility] because it  is closer.  He did have a period of resolution of symptoms partially, but  then his symptoms have returned and are now worse.   REVIEW OF SYSTEMS:  The patient is healthy, has a negative review of systems  except for some difficulty breathing and shortness of breath.   ALLERGIES:  HE HAS NO ALLERGIES.   PAST MEDICAL HISTORY:  He has hereditary heart disease.   SURGERIES:  None.   MEDICATIONS:  He takes Cardizem 180 mg a day.   FAMILY HISTORY:  Heart disease.   SOCIAL HISTORY:  Dr. Gerda Diss is his physician.  He is married.  He is a  Runner, broadcasting/film/video.  Does not smoke or drink.  He has a Master's Degree.   PHYSICAL EXAMINATION:  His weight is 205.  Pulse is 80, respiratory rate 18.  His appearance revealed normal development and nutrition.  HEAD, EYES, EARS, NOSE AND THROAT:  No abnormalities.  NECK:  Supple  with no lymphadenopathy.  RESPIRATORY EXAM:  Clear chest.  CARDIOVASCULAR:  Normal pulses and temperature.  No edema, tenderness or  swelling.  ABDOMEN:  Soft, nondistended.  MUSCULOSKELETAL:  Normal gait and station, but the right knee exhibited  medial joint line tenderness, full range of motion, positive McMurray's  test.  Normal motor exam normal.  Normal stability test.  His other three  extremities on inspection showed no malalignment.  Range of motion  evaluation was without contracture, crepitation or pain.  Stability tests  were normal and muscle strength and tone showed no abnormality.  Skin of  each of the four extremities was normal.  NEUROPSYCHIATRIC:  Evaluation showed normal coordination tests, reflexes and  sensation.  He was oriented x3 with pleasant mood.   IMPRESSION:  Medial meniscal tear right knee.   PLAN:  Arthroscopy right knee.  The informed consent process was completed.   DIAGNOSIS:  Right knee  medial meniscal tear.   TREATMENT/PROCEDURE:  Arthroscopy, partial medial meniscectomy.  Most likely  and significant risks:  Continued pain, retear of meniscus, stiffness,  infection, DVT.  Benefits:  Improved knee function.  Alternatives:  Nonoperative care with physical therapy and/or injections, anti-  inflammatories, bracing.   The patient was advised of the risks and benefits, realistic alternatives  and expected outcomes.  I gave the patient the opportunity to ask questions  and I answered their questions.  They agreed to proceed with surgery.      Vickki Hearing, M.D.  Electronically Signed     SEH/MEDQ  D:  07/18/2006  T:  07/18/2006  Job:  [id number]   cc:   [facility]   Vickki Hearing, M.D.  Fax: [phone]

## 2011-01-19 NOTE — Assessment & Plan Note (Signed)
[facility]                              CARDIOLOGY OFFICE NOTE   SENON, NIXON                 MRN:          [id number]  DATE:04/08/2006                            DOB:          1970-12-02    HISTORY OF PRESENT ILLNESS:  Casey Holland is 40 years old and has known  hypertrophic cardiomyopathy and a family history of hypertrophic  cardiomyopathy.  His dad just died in the hospital following surgery for  esophageal cancer.   Over the last few months, Casey Holland has had increasing difficulty with  shortness of breath with exertion.  He has also had some gurgling sensation  in his lungs which he says feels like fluid, although, he says he does not  particularly feel wheezing.  He thought this might be related to allergies  since it appears somewhat seasonal.  He has also gained some weight and has  been less active and feels like this could be contributing to his decreased  exercise tolerance.  He saw Dr. Gerda Diss for these symptoms before, and he  tried him on some inhalers and it seemed to help some.  He comes back today  at the urging of his wife and at the recommendation of Dr. Gerda Diss to see if  there is a cardiac etiology to his decreased exercise tolerance.   He says he has had no chest pain, no palpitations and no syncope.   PAST MEDICAL HISTORY:  Negative.   SOCIAL HISTORY:  He works for [employer] and has one child.   CURRENT MEDICATIONS:  Vitamins.   PHYSICAL EXAMINATION:  VITAL SIGNS:  Blood pressure 110/76, pulse 98 and  regular.  No venous distension.  Carotids were full without bruits.  CHEST:  Clear.  CARDIAC:  Rhythm was regular.  There was a grade 3/6 harsh systolic murmur  at the left sternal edge radiating to the edge.  I could see no diastolic  murmur.  ABDOMEN:  Soft with normal bowel sounds.  No hepatosplenomegaly.  Peripheral  pulses full with no peripheral edema.   STUDIES:  Electrocardiogram today  showed LVH and a pseudoinfarction pattern  with inferior Q waves and lateral Q waves.   An echocardiogram done today showed septal thickness of 1.7, posterior wall  thickness of 1.6.  The cavity was quite small and hyperdynamic, and there  was systolic anterior motion of the mitral valve with marked outflow  obstruction.  The peak gradient had increased from 52 mmHg in 2005 to 117  mmHg today.   IMPRESSION:  1.  Hypertrophic cardiomyopathy with increasing subaortic valve gradient.  2.  Symptoms of dyspnea and decreased exercise tolerance, possibly related      to #1 versus asthma.  3.  History of allergies and wheezing and possible bronchitis.   RECOMMENDATIONS:  I have reviewed the echo today with Dr. Gala Romney and  discussed the findings.  We plan to evaluate him with a CPX test to help  determine the relative limitations from a cardiac standpoint and a pulmonary  standpoint, although, based on the echo findings, I think it is likely he is  cardiac limited.  We will start him on Cardizem CD 120 today.  I will see  him back in four weeks and see how he has responded to therapy and discuss  the results of his CPX testing.  The pulmonary function tests will be done  as part of the CPX testing.                               Bruce Elvera Lennox Juanda Chance, MD, [facility]    BRB/MedQ  DD:  04/08/2006  DT:  04/09/2006  Job #:  [id number]   cc:   Lorin Picket A. Gerda Diss, MD

## 2011-01-19 NOTE — Assessment & Plan Note (Signed)
[facility]                              CARDIOLOGY OFFICE NOTE   LOVELACE, CERVENY                 MRN:          [id number]  DATE:05/08/2006                            DOB:          05-16-1971    CLINICAL HISTORY:  Jalynn Betzold returned for a followup evaluation after  his recent CPX testing.  On recent CPX testing he was found to have a  reduced VO2 Max which was 25 ml/kg/minute which was 66% of predicted age  (sedentary).  His findings were indicative of cardiac limitation rather than  pulmonary limitation.   Overall, Nicoles has not been too active since I saw him last time, so it is  difficult to assess his functional limitations.  He has had no chest pain  and no palpitations.   PAST MEDICAL HISTORY:  His past medical history is significant for asthma  and allergies.   CURRENT MEDICATIONS:  Just Cardizem CD 120 a day.   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  He had worked at [employer] and has a child.   PHYSICAL EXAMINATION:  VITAL SIGNS:  On examination today the blood pressure  is 111/71 and the pulse 71 and regular.  There was no venous distension.  Carotid pulses were full without bruits.  CHEST:  Clear without rales or rhonchi.  CARDIAC:  Rhythm is regular.  There is grade 2-3/6 systolic ejection murmur  at left sternal edge.  ABDOMEN:  Soft with normal bowel sounds.  EXTREMITIES:  Reflect no peripheral edema.   IMPRESSION:  1. Hypertrophic cardiomyopathy with decreased exercise tolerance by CPX      testing related to his cardiac condition.  2. History of asthma and allergies.   RECOMMENDATIONS:  The CPX testing strongly indicates that Avyay's limitations  are related to his hypertrophic cardiomyopathy.  We will plan to titrate his  medical therapy and I have increased his Cardizem from 120 to 180 a day.  He  is a little reluctant to take a beta blocker  because of perceived side  effects.  Hopefully, his blood pressure will tolerate titrations in his  medications.  His blood pressure response on treadmill testing was  borderline in terms of risk of sudden cardiac death.  His septum is not  greatly thickened and he has no positive family history of sudden cardiac  death and no ventricular tachycardia.  I urged him to get into a regular  exercise program with either walking or walking on a treadmill or biking and  told him that we should get his heart rate up to about 125-130 which would  be the lower limit __________  as  long as he has only mild symptoms of dyspnea at that level.  I will plan to  see him back in six weeks.                               Bruce Elvera Lennox Juanda Chance, MD, [facility]    BRB/MedQ  DD:  05/08/2006  DT:  05/09/2006  Job #:  [id number]

## 2011-01-25 ENCOUNTER — Encounter: Payer: Self-pay | Admitting: *Deleted

## 2011-01-30 ENCOUNTER — Other Ambulatory Visit (HOSPITAL_COMMUNITY): Payer: Self-pay | Admitting: Radiology

## 2011-01-30 DIAGNOSIS — I4891 Unspecified atrial fibrillation: Secondary | ICD-10-CM

## 2011-01-31 ENCOUNTER — Encounter: Payer: Self-pay | Admitting: Internal Medicine

## 2011-01-31 ENCOUNTER — Other Ambulatory Visit (HOSPITAL_COMMUNITY): Payer: Self-pay | Admitting: Radiology

## 2011-01-31 ENCOUNTER — Ambulatory Visit (HOSPITAL_COMMUNITY): Payer: BC Managed Care – PPO | Attending: Internal Medicine

## 2011-01-31 ENCOUNTER — Ambulatory Visit (INDEPENDENT_AMBULATORY_CARE_PROVIDER_SITE_OTHER): Payer: BC Managed Care – PPO | Admitting: Internal Medicine

## 2011-01-31 DIAGNOSIS — I379 Nonrheumatic pulmonary valve disorder, unspecified: Secondary | ICD-10-CM | POA: Insufficient documentation

## 2011-01-31 DIAGNOSIS — I079 Rheumatic tricuspid valve disease, unspecified: Secondary | ICD-10-CM | POA: Insufficient documentation

## 2011-01-31 DIAGNOSIS — I421 Obstructive hypertrophic cardiomyopathy: Secondary | ICD-10-CM

## 2011-01-31 DIAGNOSIS — I319 Disease of pericardium, unspecified: Secondary | ICD-10-CM | POA: Insufficient documentation

## 2011-01-31 DIAGNOSIS — I059 Rheumatic mitral valve disease, unspecified: Secondary | ICD-10-CM | POA: Insufficient documentation

## 2011-01-31 DIAGNOSIS — R109 Unspecified abdominal pain: Secondary | ICD-10-CM

## 2011-01-31 DIAGNOSIS — I4891 Unspecified atrial fibrillation: Secondary | ICD-10-CM

## 2011-01-31 MED ORDER — PANTOPRAZOLE SODIUM 40 MG PO TBEC: 40.0000 mg | DELAYED_RELEASE_TABLET | Freq: Every day | ORAL | Status: DC

## 2011-01-31 NOTE — Progress Notes (Signed)
PCP:  [facility]  HPI:  Casey Holland is a 40 year old man with a h/o hypertrophic cardiomyopathy. In April of 2010 he underwent a myectomy at the [facility] with the reduction in his subvalvular gradient from 90 mm to 0 mm. He had a stress test following his myectomy procedure here in our office and had no drop in his blood pressure and no ventricular tachycardia. He did have some second degree AV block. He has left bundle branch block on his ECG since his myectomy. He was previous followed by Dr. Juanda Chance and presents today to establish care with me. Did not have cardiac cath prior to surgery.   Recently started on fenofibrate for high TGs.   He had echo today (which I reviewed personally). EF 65-70%. IVS 1.9 cm PW 21 (previously 17-18 cm)  LVOT gradient minimal at rest 16 with valsalva. ?small VSD or fistulous tract. +diastolic dysfunction and [name].   Had been exercising about 3x/week with TM (3-5 mph with 5% grade) and elliptical. Larey Seat off wagon about a month ago. Denies CP or SOB. Says that he gets abdominal burning and SOB with exertion. This is chronic but feels it is getting somewhat worse. No CP, edema, orthopnea or PND. Weight stable.    ROS: All systems negative except as listed in HPI, PMH and Problem List.  Past Medical History  Diagnosis Date  . Cardiomyopathy, hypertrophic nonobstructive     subaortic valve gradient of with recent congestive heart failure and volume overload, now improved with diuretics  . 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 Asthma     Current Outpatient Prescriptions  Medication Sig Dispense Refill  . aspirin 81 MG tablet Take 81 mg by mouth daily.        . carvedilol (COREG) 6.25 MG tablet Take 6.25 mg by mouth 2 (two) times daily with a meal.           PHYSICAL EXAM: Filed Vitals:   01/31/11 1638  BP: 120/69  Pulse: 58  Resp: 12   General:  Well appearing. No resp difficulty HEENT: normal Neck: supple. JVP flat. Carotids 2+  bilaterally; no bruits. No lymphadenopathy or thryomegaly appreciated. Cor: PMI normal. Regular rate & rhythm. No rubs, gallops or murmurs. Lungs: clear Abdomen: soft, nontender, nondistended. No hepatosplenomegaly. No bruits or masses. Good bowel sounds. Extremities: no cyanosis, clubbing, rash, edema Neuro: alert & orientedx3, cranial nerves grossly intact. Moves all 4 extremities w/o difficulty. Affect pleasant.    ECG: Sinus brady 58 biatrial enlargement. LBBB   ASSESSMENT & PLAN:

## 2011-01-31 NOTE — Assessment & Plan Note (Signed)
Unsure if related to high filling pressure or perhaps GERD. Will do 1 month trial of Protonix and await CPX test results.

## 2011-01-31 NOTE — Patient Instructions (Signed)
Start Protonix 40 mg daily Your physician has recommended that you have a cardiopulmonary stress test (CPX). CPX testing is a non-invasive measurement of heart and lung function. It replaces a traditional treadmill stress test. This type of test provides a tremendous amount of information that relates not only to your present condition but also for future outcomes. This test combines measurements of you ventilation, respiratory gas exchange in the lungs, electrocardiogram (EKG), blood pressure and physical response before, during, and following an exercise protocol. Your physician has requested that you have an echocardiogram. Echocardiography is a painless test that uses sound waves to create images of your heart. It provides your doctor with information about the size and shape of your heart and how well your heart's chambers and valves are working. This procedure takes approximately one hour. There are no restrictions for this procedure.  NEEDS IN 6 MONTHS. Your physician wants you to follow-up in: 6 months.  You will receive a reminder letter in the mail two months in advance. If you don't receive a letter, please call our office to schedule the follow-up appointment.

## 2011-01-31 NOTE — Assessment & Plan Note (Signed)
Doing fairly well but still feels like he has significant functional limitation related to diastolic dysfunction. Will check CPX to assess. Repeat echo in 6 months to make sure LVH isn't getting worse. Stressed need for regular exercise.

## 2011-05-02 ENCOUNTER — Telehealth: Payer: Self-pay | Admitting: Internal Medicine

## 2011-05-02 DIAGNOSIS — I421 Obstructive hypertrophic cardiomyopathy: Secondary | ICD-10-CM

## 2011-05-02 NOTE — Telephone Encounter (Signed)
Scheduling an echo & md appt.

## 2011-05-02 NOTE — Telephone Encounter (Signed)
Pt is due for 6 month echo and f/u in Nov, Gesila can you please schedule both for 11/14, the order for echo is in, thanks

## 2011-05-02 NOTE — Telephone Encounter (Signed)
Pt needs fu and echo, ok to wait until January?

## 2011-07-18 ENCOUNTER — Encounter: Payer: Self-pay | Admitting: Internal Medicine

## 2011-07-18 ENCOUNTER — Ambulatory Visit (INDEPENDENT_AMBULATORY_CARE_PROVIDER_SITE_OTHER): Payer: BC Managed Care – PPO | Admitting: Internal Medicine

## 2011-07-18 ENCOUNTER — Ambulatory Visit (HOSPITAL_COMMUNITY): Payer: BC Managed Care – PPO | Attending: Cardiology | Admitting: Radiology

## 2011-07-18 VITALS — BP 110/78 | HR 72 | Ht 73.0 in | Wt 222.4 lb

## 2011-07-18 DIAGNOSIS — I509 Heart failure, unspecified: Secondary | ICD-10-CM | POA: Insufficient documentation

## 2011-07-18 DIAGNOSIS — I5032 Chronic diastolic (congestive) heart failure: Secondary | ICD-10-CM

## 2011-07-18 DIAGNOSIS — I059 Rheumatic mitral valve disease, unspecified: Secondary | ICD-10-CM | POA: Insufficient documentation

## 2011-07-18 DIAGNOSIS — I4891 Unspecified atrial fibrillation: Secondary | ICD-10-CM | POA: Insufficient documentation

## 2011-07-18 DIAGNOSIS — I379 Nonrheumatic pulmonary valve disorder, unspecified: Secondary | ICD-10-CM | POA: Insufficient documentation

## 2011-07-18 DIAGNOSIS — I421 Obstructive hypertrophic cardiomyopathy: Secondary | ICD-10-CM

## 2011-07-18 DIAGNOSIS — I319 Disease of pericardium, unspecified: Secondary | ICD-10-CM | POA: Insufficient documentation

## 2011-07-18 DIAGNOSIS — I079 Rheumatic tricuspid valve disease, unspecified: Secondary | ICD-10-CM | POA: Insufficient documentation

## 2011-07-18 NOTE — Assessment & Plan Note (Addendum)
I reviewed his echo images with him in detail and I also reviewed CPX results at length. HOCM resolved with septal myomectomy but still with stiff heart. CPX shows mild circulatory limitation due to diastolic dysfunction. Stressed need to continue exercise program. May need low dose diuretic at some point. Will continue beta-blocker.

## 2011-07-18 NOTE — Patient Instructions (Signed)
Your physician wants you to follow-up in: 6 months in heart failure clinic with Dr. Gala Romney. You will receive a reminder letter in the mail two months in advance. If you don't receive a letter, please call our office to schedule the follow-up appointment.

## 2011-07-18 NOTE — Progress Notes (Signed)
  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 In April of 2010 he underwent a myectomy at the [facility] with the reduction in his subvalvular gradient from 90 mm to 0 mm. He had a stress test following his myectomy procedure here in our office and had no drop in his blood pressure and no ventricular tachycardia. He did have some second degree AV block. He has left bundle branch block on his ECG since his myectomy.  Underwent CPX testing 5/12 which showed mild functional limitation due to a circulatory limitation.   Under a lot of stress at home and at work mostly due to financial issues. Brother also has HOCM and is s/p myectomy now having problems with AF and looking at ablation at [facility]. He is worried he will have the same problem.   Was exercising in gym 3-4x/week until October but now not exercising as much. Says he can walk forever on flat ground but gets SOB with hills. No orthopnea, PND or edema. No further palpitations. No light headedness. Has gained 10 pounds.   We reviewed echo today together EF 60% Severe concentric LVH about 2.0. Biatrial enlargement E/E' 11 peak outflow gradient 5 at rest 11 with vlasalve  ROS: All systems negative except as listed in HPI, PMH and Problem List.  Past Medical History  Diagnosis Date  . Cardiomyopathy, hypertrophic nonobstructive     subaortic valve gradient of with recent congestive heart failure and volume overload, now improved with diuretics  . 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 Asthma     Current Outpatient Prescriptions  Medication Sig Dispense Refill  . aspirin 81 MG tablet Take 81 mg by mouth daily.        . carvedilol (COREG) 6.25 MG tablet Take 6.25 mg by mouth 2 (two) times daily with a meal.        . FENOFIBRATE PO Take by mouth daily.           PHYSICAL EXAM: Filed Vitals:   07/18/11 1108  BP: 110/78  Pulse: 72   General:  Well  appearing. No resp difficulty HEENT: normal Neck: supple. JVP flat. Carotids 2+ bilaterally; no bruits. No lymphadenopathy or thryomegaly appreciated. Cor: PMI normal. Regular rate & rhythm. No rubs, gallops 2/6 TR murmur. No LVOT murmur Lungs: clear Abdomen: soft, nontender, nondistended. No hepatosplenomegaly. No bruits or masses. Good bowel sounds. Extremities: no cyanosis, clubbing, rash, edema Neuro: alert & orientedx3, cranial nerves grossly intact. Moves all 4 extremities w/o difficulty. Affect pleasant.  ECG:  SR with 1AVB (248) LBBB    ASSESSMENT & PLAN:

## 2011-10-03 ENCOUNTER — Telehealth: Payer: Self-pay | Admitting: Internal Medicine

## 2011-10-03 ENCOUNTER — Other Ambulatory Visit: Payer: Self-pay

## 2011-10-03 MED ORDER — CARVEDILOL 6.25 MG PO TABS: 6.2500 mg | ORAL_TABLET | Freq: Two times a day (BID) | ORAL | Status: DC

## 2011-10-03 NOTE — Telephone Encounter (Signed)
Pt needs refill Carvedilol 6.25mg  [facility] [place] states they faxed twice, pt out needs asap

## 2012-02-18 ENCOUNTER — Telehealth: Payer: Self-pay | Admitting: Internal Medicine

## 2012-02-18 ENCOUNTER — Telehealth (HOSPITAL_COMMUNITY): Payer: Self-pay | Admitting: *Deleted

## 2012-02-18 NOTE — Telephone Encounter (Signed)
He needs to be seen here, please schedule

## 2012-02-18 NOTE — Telephone Encounter (Signed)
Mr Casey Holland called today regarding his 6 month follow up appointment.  He would like to know where he is going to be seen. Please review and follow up. Thanks.

## 2012-02-18 NOTE — Telephone Encounter (Signed)
Dawn can you please schedule, thanks

## 2012-02-18 NOTE — Telephone Encounter (Signed)
Pt 's snap shot states he has diastolic heart failure --he would like an appoint with dr bensimhon--advised to call [facility] [place] [phone]--NW agrees

## 2012-02-18 NOTE — Telephone Encounter (Signed)
New msg Pt wants to make appt with Dr Gala Romney. Does he need to schedule at [facility] clinic.?

## 2012-03-16 ENCOUNTER — Other Ambulatory Visit: Payer: Self-pay | Admitting: Internal Medicine

## 2012-03-24 ENCOUNTER — Encounter (HOSPITAL_COMMUNITY): Payer: Self-pay

## 2012-03-24 ENCOUNTER — Ambulatory Visit (HOSPITAL_COMMUNITY)
Admission: RE | Admit: 2012-03-24 | Discharge: 2012-03-24 | Disposition: A | Discharge status: Home / Self Care | Payer: BC Managed Care – PPO | Source: Ambulatory Visit | Attending: Internal Medicine | Admitting: Internal Medicine

## 2012-03-24 VITALS — BP 100/70 | HR 70 | Ht 73.0 in | Wt 225.0 lb

## 2012-03-24 DIAGNOSIS — I4891 Unspecified atrial fibrillation: Secondary | ICD-10-CM | POA: Insufficient documentation

## 2012-03-24 DIAGNOSIS — I5032 Chronic diastolic (congestive) heart failure: Secondary | ICD-10-CM

## 2012-03-24 DIAGNOSIS — R109 Unspecified abdominal pain: Secondary | ICD-10-CM

## 2012-03-24 DIAGNOSIS — I421 Obstructive hypertrophic cardiomyopathy: Secondary | ICD-10-CM

## 2012-03-24 NOTE — Progress Notes (Signed)
Patient ID: Casey Holland, male   DOB: 1971/02/08, 41 y.o.   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 In April of 2010 he underwent a myectomy at the [facility] with the reduction in his subvalvular gradient from 90 mm to 0 mm. He had a stress test following his myectomy procedure here in our office and had no drop in his blood pressure and no ventricular tachycardia. He did have some second degree AV block. He has left bundle branch block on his ECG since his myectomy.  Underwent CPX testing 5/12 which showed mild functional limitation due to a circulatory limitation.   Under a lot of stress at home and at work mostly due to financial issues. Brother also has HOCM and is s/p myectomy now having problems with AF and looking at ablation at [facility] He is worried he will have the same problem.   EF 60% Severe concentric LVH about 2.0. Biatrial enlargement E/E' 11 peak outflow gradient 5 at rest 11 with valsalva.   He returns for follow up. Denies PND/Orthopnea. Dyspnea when exercising. Exercising 3 times a week with elliptical and treadmill about 20 mins on each. On TM goes 3.5-4.0 mph 2-3% incline. Has not changed. No problem with ADLs. No dizziness with exercise. Says he has severe diffuse ab pain and bloating during exercise when HR > 130. Not with eating. Never gets blood in stool after exercise. This is unchanged. Was evaluated for this at [facility] many years ago and was told it was OK. Has never had endoscopy. No gas or burping. No problems with stool. No reflux. Took prilosec for awhile without benefit.   ROS: All systems negative except as listed in HPI, PMH and Problem List.  Past Medical History  Diagnosis Date  . Cardiomyopathy, hypertrophic nonobstructive     subaortic valve gradient of with recent congestive heart failure and volume overload, now improved with diuretics  . Heart failure     Class  II to III heart failure   . Asthma     Current Outpatient Prescriptions  Medication Sig Dispense Refill  . aspirin 81 MG tablet Take 81 mg by mouth daily.        . carvedilol (COREG) 6.25 MG tablet TAKE ONE TABLET BY MOUTH TWICE DAILY WITH MEALS  60 tablet  3  . FENOFIBRATE PO Take by mouth daily.        . pravastatin (PRAVACHOL) 20 MG tablet Take 1 tablet by mouth Daily.      Marland Kitchen triamcinolone cream (KENALOG) 0.1 % prn         PHYSICAL EXAM: Filed Vitals:   03/24/12 1405  BP: 100/70  Pulse: 70   General:  Well appearing. No resp difficulty HEENT: normal Neck: supple. JVP flat. Carotids 2+ bilaterally; no bruits. No lymphadenopathy or thryomegaly appreciated. Cor: PMI normal. Regular rate & rhythm. No rubs, gallops 2/6 TR murmur. No LVOT murmur Lungs: clear Abdomen: soft, nontender, nondistended. No hepatosplenomegaly. No bruits or masses. Good bowel sounds. Extremities: no cyanosis, clubbing, rash, edema Neuro: alert & orientedx3, cranial nerves grossly intact. Moves all 4 extremities w/o difficulty. Affect pleasant.  ECG:  SR with 1AVB (248) LBBB    ASSESSMENT & PLAN:

## 2012-03-24 NOTE — Patient Instructions (Addendum)
Your physician has requested that you have an echocardiogram. Echocardiography is a painless test that uses sound waves to create images of your heart. It provides your doctor with information about the size and shape of your heart and how well your heart's chambers and valves are working. This procedure takes approximately one hour. There are no restrictions for this procedure.  In 4 months.  Your physician has recommended that you have a cardiopulmonary stress test (CPX). CPX testing is a non-invasive measurement of heart and lung function. It replaces a traditional treadmill stress test. This type of test provides a tremendous amount of information that relates not only to your present condition but also for future outcomes. This test combines measurements of you ventilation, respiratory gas exchange in the lungs, electrocardiogram (EKG), blood pressure and physical response before, during, and following an exercise protocol.  In 4 months  We will contact you in 4 months to schedule your next appointment.

## 2012-03-30 NOTE — Assessment & Plan Note (Signed)
Patient seen and examined with Tonye Becket, NP. We discussed all aspects of the encounter. I agree with the assessment and plan as stated above.   Well compensated. Reinforced need for daily weights and reviewed use of sliding scale diuretics.

## 2012-03-30 NOTE — Assessment & Plan Note (Signed)
He has exertional ab pain at high levels of exertion. I suspect this may be transient gut ischemia but has never had bloody stools. Symptoms have been chronic for years. I have asked him to keep his exercise HR < 125. If symptoms persist would get CPX testing to further evaluate.

## 2012-03-30 NOTE — Assessment & Plan Note (Signed)
He is s/p surgical repair. Doing well. Due for repeat echo. Long talk about screening of his children. I called Dr. Theodis Sato (peds cards) to discuss and we will arrange for them to see him soon for screening. We also discussed genetic screening for him to try and identify genetic defect. He said he underwent genetic testing at [facility] but was never given the results. He will get [place] contact info for who he saw and we will attempt to get this information for him.

## 2012-06-04 ENCOUNTER — Telehealth: Payer: Self-pay | Admitting: Internal Medicine

## 2012-06-04 NOTE — Telephone Encounter (Signed)
Pt has been calling for a couple days, I don't see that message, however pt doesn't want to call back there, so he called here asking [place] to contact you re setting up some test dr bensimhon wanted before he next appt, pls call

## 2012-06-06 ENCOUNTER — Telehealth (HOSPITAL_COMMUNITY): Payer: Self-pay | Admitting: Cardiology

## 2012-06-06 DIAGNOSIS — I5032 Chronic diastolic (congestive) heart failure: Secondary | ICD-10-CM

## 2012-06-06 NOTE — Telephone Encounter (Addendum)
ORDER PLACED FOR UPCOMING ECHO and CPX

## 2012-06-11 NOTE — Telephone Encounter (Signed)
appt's scheduled

## 2012-06-18 ENCOUNTER — Telehealth (HOSPITAL_COMMUNITY): Payer: Self-pay | Admitting: Cardiology

## 2012-06-18 NOTE — Addendum Note (Signed)
Addended by: JEFFRIES, Milagros Reap on: 06/18/2012 11:18 AM   Modules accepted: Orders

## 2012-06-24 NOTE — Telephone Encounter (Signed)
Opened in error

## 2012-06-26 ENCOUNTER — Ambulatory Visit (HOSPITAL_COMMUNITY): Payer: BC Managed Care – PPO | Attending: Internal Medicine

## 2012-06-26 DIAGNOSIS — I5032 Chronic diastolic (congestive) heart failure: Secondary | ICD-10-CM

## 2012-06-26 DIAGNOSIS — I509 Heart failure, unspecified: Secondary | ICD-10-CM | POA: Insufficient documentation

## 2012-06-26 DIAGNOSIS — I5022 Chronic systolic (congestive) heart failure: Secondary | ICD-10-CM | POA: Insufficient documentation

## 2012-07-22 ENCOUNTER — Other Ambulatory Visit: Payer: Self-pay | Admitting: Internal Medicine

## 2012-07-22 MED ORDER — CARVEDILOL 6.25 MG PO TABS: 6.2500 mg | ORAL_TABLET | Freq: Two times a day (BID) | ORAL | Status: DC

## 2012-08-22 ENCOUNTER — Ambulatory Visit (HOSPITAL_COMMUNITY)
Admission: RE | Admit: 2012-08-22 | Discharge: 2012-08-22 | Disposition: A | Discharge status: Home / Self Care | Payer: BC Managed Care – PPO | Source: Ambulatory Visit | Attending: Internal Medicine | Admitting: Internal Medicine

## 2012-08-22 VITALS — BP 112/74 | HR 63 | Wt 227.4 lb

## 2012-08-22 DIAGNOSIS — I5032 Chronic diastolic (congestive) heart failure: Secondary | ICD-10-CM | POA: Insufficient documentation

## 2012-08-22 DIAGNOSIS — Z8249 Family history of ischemic heart disease and other diseases of the circulatory system: Secondary | ICD-10-CM | POA: Insufficient documentation

## 2012-08-22 DIAGNOSIS — I10 Essential (primary) hypertension: Secondary | ICD-10-CM | POA: Insufficient documentation

## 2012-08-22 DIAGNOSIS — I421 Obstructive hypertrophic cardiomyopathy: Secondary | ICD-10-CM | POA: Insufficient documentation

## 2012-08-22 DIAGNOSIS — J45909 Unspecified asthma, uncomplicated: Secondary | ICD-10-CM | POA: Insufficient documentation

## 2012-08-22 DIAGNOSIS — E785 Hyperlipidemia, unspecified: Secondary | ICD-10-CM | POA: Insufficient documentation

## 2012-08-22 DIAGNOSIS — I509 Heart failure, unspecified: Secondary | ICD-10-CM

## 2012-08-22 MED ORDER — METOPROLOL SUCCINATE ER 50 MG PO TB24: 50.0000 mg | ORAL_TABLET | Freq: Every day | ORAL | Status: DC

## 2012-08-22 NOTE — Progress Notes (Signed)
  Echocardiogram 2D Echocardiogram has been performed.  Kassadie Pancake 08/22/2012, 10:16 AM

## 2012-08-22 NOTE — Patient Instructions (Addendum)
Stop carvedilol.  Start toprol xl 50 mg daily.

## 2012-08-22 NOTE — Progress Notes (Addendum)
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 In April of 2010 he underwent a myectomy at the [facility] with the reduction in his subvalvular gradient from 90 mm to 0 mm. He had a stress test following his myectomy procedure here in our office and had no drop in his blood pressure and no ventricular tachycardia. He did have some second degree AV block. He has left bundle branch block on his ECG since his myectomy.  Underwent CPX testing 5/12 which showed mild functional limitation due to a circulatory limitation.   EF 60% Severe concentric LVH about 2.0. Biatrial enlargement E/E' 11 peak outflow gradient 5 at rest 11 with valsalva.   CPX 06/26/12:  VE/VCO2 slope 35.2, Peak VO2 16.8 (% pred 51%), RER 1.18, Vent threshold 12.6 (% pred 38.3%), OUES 1.59. Blunted BP response to exercise. During CPX had sudden, brief drops in HR which then resolved.   Echo 08/22/12: Mod-severe LVH, LVEF 55-65%, no [name]  He returns for follow up today.  He is frustrated because he can't exercise enough to get the weight off.  He can try and exercise but can't get HR up.  He is eating healthier.  Denies orthopnea/PND.  Snores at night.  No edema.    ROS: All systems negative except as listed in HPI, PMH and Problem List.  Past Medical History  Diagnosis Date  . Cardiomyopathy, hypertrophic nonobstructive     subaortic valve gradient of with recent congestive heart failure and volume overload, now improved with diuretics  . 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 Asthma     Current Outpatient Prescriptions  Medication Sig Dispense Refill  . aspirin 81 MG tablet Take 81 mg by mouth daily.        . carvedilol (COREG) 6.25 MG tablet Take 1 tablet (6.25 mg total) by mouth 2 (two) times daily with a meal.  60 tablet  5  . FENOFIBRATE PO Take by mouth daily.        . pravastatin (PRAVACHOL) 20 MG tablet Take 1 tablet by mouth  Daily.      Marland Kitchen triamcinolone cream (KENALOG) 0.1 % prn         PHYSICAL EXAM: Filed Vitals:   08/22/12 1018  BP: 112/74  Pulse: 63  Weight: 227 lb 6.4 oz (103.148 kg)  SpO2: 98%    General:  Well appearing. No resp difficulty HEENT: normal Neck: supple. JVP 6. Carotids 2+ bilaterally; no bruits. No lymphadenopathy or thryomegaly appreciated. Cor: PMI normal. Regular rate & rhythm. No rubs, gallops 2/6 TR murmur. No LVOT murmur with valsalva Lungs: clear Abdomen: soft, nontender, nondistended. No hepatosplenomegaly. No bruits or masses. Good bowel sounds. Extremities: no cyanosis, clubbing, rash, edema Neuro: alert & orientedx3, cranial nerves grossly intact. Moves all 4 extremities w/o difficulty. Affect pleasant.     ASSESSMENT & PLAN:

## 2012-08-22 NOTE — Assessment & Plan Note (Addendum)
Very tough situation. His exercise tolerance remains markedly diminished which is confirmed by CPX which shows a significant cardiac limitation with blunted BP response even after septal reduction. Will switch carvedilol to Toprol to get benefits of b-blockade without alpha blocking; though I am not sure this will improve his symptoms much. We discussed the fact that he has 2/5 high-risk markers for SCD (septal thickness and blunted BP response to exercise) and we could consider ICD. I have suggested referral to Dr. Nolon Rod at [facility] to see if he has any other thoughts on how to optimize.

## 2013-01-03 ENCOUNTER — Other Ambulatory Visit: Payer: Self-pay | Admitting: Family Medicine

## 2013-05-06 ENCOUNTER — Other Ambulatory Visit: Payer: Self-pay

## 2013-05-06 MED ORDER — PRAVASTATIN SODIUM 40 MG PO TABS: ORAL_TABLET | ORAL | Status: DC

## 2013-05-06 MED ORDER — FENOFIBRATE 160 MG PO TABS: ORAL_TABLET | ORAL | Status: DC

## 2013-07-03 ENCOUNTER — Other Ambulatory Visit: Payer: Self-pay | Admitting: *Deleted

## 2013-07-03 ENCOUNTER — Telehealth: Payer: Self-pay | Admitting: Family Medicine

## 2013-07-03 DIAGNOSIS — I5032 Chronic diastolic (congestive) heart failure: Secondary | ICD-10-CM

## 2013-07-03 DIAGNOSIS — I4891 Unspecified atrial fibrillation: Secondary | ICD-10-CM

## 2013-07-03 DIAGNOSIS — I421 Obstructive hypertrophic cardiomyopathy: Secondary | ICD-10-CM

## 2013-07-03 MED ORDER — PRAVASTATIN SODIUM 40 MG PO TABS: ORAL_TABLET | ORAL | Status: DC

## 2013-07-03 MED ORDER — FENOFIBRATE 160 MG PO TABS: ORAL_TABLET | ORAL | Status: DC

## 2013-07-03 NOTE — Telephone Encounter (Signed)
Left message on cell phone regarding: Patient has an appointment scheduled for Monday November 24 at 3:30pm and would like to know if he needs Blood Work completed prior to this appointment. I sent in his orders for BW and refilled his 2 prescriptions.  

## 2013-07-03 NOTE — Telephone Encounter (Signed)
Left message on cell phone regarding: Patient has an appointment scheduled for Monday November 24 at 3:30pm and would like to know if he needs Blood Work completed prior to this appointment. I sent in his orders for BW and refilled his 2 prescriptions.

## 2013-07-03 NOTE — Telephone Encounter (Signed)
Patient has an appointment scheduled for Monday November 24 at 3:30pm and would like to know if he needs Blood Work completed prior to this appointment.  Please call

## 2013-07-03 NOTE — Telephone Encounter (Signed)
Patient needs refills on the following medications:  fenofibrate 160 MG tablet & pravastatin (PRAVACHOL) 40 MG tablet  [facility] in [place]

## 2013-07-17 LAB — BASIC METABOLIC PANEL
CO2: 27 mEq/L (ref 19–32)
Calcium: 10 mg/dL (ref 8.4–10.5)
Glucose, Bld: 95 mg/dL (ref 70–99)
Potassium: 4.7 mEq/L (ref 3.5–5.3)
Sodium: 136 mEq/L (ref 135–145)

## 2013-07-17 LAB — LIPID PANEL
Cholesterol: 183 mg/dL (ref 0–200)
HDL: 35 mg/dL — ABNORMAL LOW (ref >39)
LDL Cholesterol: 117 mg/dL — ABNORMAL HIGH (ref 0–99)
Total CHOL/HDL Ratio: 5.2 Ratio

## 2013-07-17 LAB — HEPATIC FUNCTION PANEL
ALT: 24 U/L (ref 0–53)
AST: 24 U/L (ref 0–37)
Albumin: 4.7 g/dL (ref 3.5–5.2)
Alkaline Phosphatase: 37 U/L — ABNORMAL LOW (ref 39–117)

## 2013-07-25 ENCOUNTER — Encounter: Payer: Self-pay | Admitting: *Deleted

## 2013-07-27 ENCOUNTER — Encounter: Payer: Self-pay | Admitting: Family Medicine

## 2013-07-27 ENCOUNTER — Ambulatory Visit (INDEPENDENT_AMBULATORY_CARE_PROVIDER_SITE_OTHER): Payer: BC Managed Care – PPO | Admitting: Family Medicine

## 2013-07-27 VITALS — BP 116/70 | Ht 73.0 in | Wt 237.0 lb

## 2013-07-27 DIAGNOSIS — E785 Hyperlipidemia, unspecified: Secondary | ICD-10-CM | POA: Insufficient documentation

## 2013-07-27 DIAGNOSIS — J45909 Unspecified asthma, uncomplicated: Secondary | ICD-10-CM

## 2013-07-27 DIAGNOSIS — I5032 Chronic diastolic (congestive) heart failure: Secondary | ICD-10-CM

## 2013-07-27 DIAGNOSIS — M722 Plantar fascial fibromatosis: Secondary | ICD-10-CM | POA: Insufficient documentation

## 2013-07-27 DIAGNOSIS — F329 Major depressive disorder, single episode, unspecified: Secondary | ICD-10-CM

## 2013-07-27 DIAGNOSIS — I421 Obstructive hypertrophic cardiomyopathy: Secondary | ICD-10-CM

## 2013-07-27 DIAGNOSIS — K219 Gastro-esophageal reflux disease without esophagitis: Secondary | ICD-10-CM

## 2013-07-27 NOTE — Patient Instructions (Addendum)
Consider low dose lexapro if mood symptoms persists  Regular exercise is crucial

## 2013-07-27 NOTE — Progress Notes (Signed)
Subjective:    Patient ID: Casey Holland, male    DOB: 1971/07/20, 42 y.o.   MRN: [id number]  HPIHere for a follow up on bloodwork.   Had flu vaccine.   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 Now not seeing dr bensimmon, now seeing dr Regino Schultze who is the specialist in this regard.  Neck pain. Nearly every day. Gets will tight. Achy. Minimal radiation. Not exercising neck this time.   Increased reflux. Admits to eating a lot of junk food. Tries avoid caffeine intake from his reasons.  No caffeine  Agrees that he is somewhat depressed. Also prolonged grieving. No suicidal thoughts no homicidal thoughts. Some insomnia with this. Next  Ongoing challenges with plantar fascitis. He has used frequent shoes footpads and exercises   Patient also reports reflux symptoms. Worse in the evening. Often associated with waking up at night.  Results for orders placed in visit on 07/03/13  LIPID PANEL      Result Value Range   Cholesterol 183  0 - 200 mg/dL   Triglycerides 829 (*) <150 mg/dL   HDL 35 (*) >56 mg/dL   Total CHOL/HDL Ratio 5.2     VLDL 31  0 - 40 mg/dL   LDL Cholesterol 213 (*) 0 - 99 mg/dL  HEPATIC FUNCTION PANEL      Result Value Range   Total Bilirubin 1.1  0.3 - 1.2 mg/dL   Bilirubin, Direct 0.2  0.0 - 0.3 mg/dL   Indirect Bilirubin 0.9  0.0 - 0.9 mg/dL   Alkaline Phosphatase 37 (*) 39 - 117 U/L   AST 24  0 - 37 U/L   ALT 24  0 - 53 U/L   Total Protein 7.2  6.0 - 8.3 g/dL   Albumin 4.7  3.5 - 5.2 g/dL  BASIC METABOLIC PANEL      Result Value Range   Sodium 136  135 - 145 mEq/L   Potassium 4.7  3.5 - 5.3 mEq/L   Chloride 101  96 - 112 mEq/L   CO2 27  19 - 32 mEq/L   Glucose, Bld 95  70 - 99 mg/dL   BUN 20  6 - 23 mg/dL   Creat 0.86  5.78 - 4.69 mg/dL   Calcium 62.9  8.4 - 52.8 mg/dL     Review of Systems No true headache no chest pain no abdominal pain no change in bowel habits gradual weight gain no abdominal pain no palpitations no swollen ankles    Objective:   Physical Exam Alert no apparent distress neck positive pain with rotation HEENT otherwise normal. Lungs clear. Heart regular in rhythm with significant murmur. Blood pressure good on repeat. Back nontender.       Assessment & Plan:  Impression 1 hyperlipidemia controlled good though not perfect discuss. #2 hypertriglyceridemia improved. #3 hypertrophic obstructive cardiomyopathy discussed at great length. 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 #4 depression discussed compensated by grief. Patient disinclined to consider antidepressants at this time. This and call and consider counseling. #  5 chronic neck strain discussed. Patient declines muscle spasm agents. #6 plantar fasciitis ongoing. Plan diet exercise discussed. Maintain same medications. Check every 6 months. Easily 35-40 minutes spent by far most in discussion. WSL

## 2013-08-03 ENCOUNTER — Other Ambulatory Visit: Payer: Self-pay | Admitting: Family Medicine

## 2013-08-10 ENCOUNTER — Other Ambulatory Visit: Payer: Self-pay | Admitting: *Deleted

## 2013-08-10 MED ORDER — FENOFIBRATE 160 MG PO TABS: 160.0000 mg | ORAL_TABLET | Freq: Every day | ORAL | Status: DC

## 2013-08-10 MED ORDER — PRAVASTATIN SODIUM 40 MG PO TABS: 40.0000 mg | ORAL_TABLET | Freq: Every day | ORAL | Status: DC

## 2013-08-14 ENCOUNTER — Telehealth: Payer: Self-pay | Admitting: *Deleted

## 2013-08-14 ENCOUNTER — Other Ambulatory Visit: Payer: Self-pay | Admitting: *Deleted

## 2013-08-14 DIAGNOSIS — R0683 Snoring: Secondary | ICD-10-CM

## 2013-08-14 NOTE — Telephone Encounter (Signed)
[facility] 12/12  I put in referral for sleep study due to daytime drowsiness and sever snoring. Wife reports frequent apnea spells w/ strong family hx. If the sleep study is positive, pt will need OV to discuss further.

## 2013-09-02 ENCOUNTER — Other Ambulatory Visit: Payer: Self-pay | Admitting: *Deleted

## 2013-09-02 ENCOUNTER — Telehealth: Payer: Self-pay | Admitting: Family Medicine

## 2013-09-02 MED ORDER — FENOFIBRATE 160 MG PO TABS: 160.0000 mg | ORAL_TABLET | Freq: Every day | ORAL | Status: DC

## 2013-09-02 NOTE — Telephone Encounter (Signed)
Med sent to pharm. Pt notified.  

## 2013-09-02 NOTE — Telephone Encounter (Signed)
Patient needs Rx for fenofibrate. He states this Rx has expired.    [facility]

## 2013-09-22 ENCOUNTER — Other Ambulatory Visit (HOSPITAL_COMMUNITY): Payer: Self-pay

## 2013-09-22 DIAGNOSIS — G4730 Sleep apnea, unspecified: Secondary | ICD-10-CM

## 2013-09-22 DIAGNOSIS — R0683 Snoring: Secondary | ICD-10-CM

## 2013-10-02 ENCOUNTER — Encounter: Payer: Self-pay | Admitting: Neurology

## 2013-10-02 ENCOUNTER — Ambulatory Visit: Payer: BC Managed Care – PPO | Attending: Family Medicine | Admitting: Sleep Medicine

## 2013-10-02 DIAGNOSIS — G4733 Obstructive sleep apnea (adult) (pediatric): Secondary | ICD-10-CM | POA: Insufficient documentation

## 2013-10-02 DIAGNOSIS — R0683 Snoring: Secondary | ICD-10-CM

## 2013-10-02 DIAGNOSIS — G4730 Sleep apnea, unspecified: Secondary | ICD-10-CM

## 2013-10-03 NOTE — Procedures (Signed)
  [facility] [name], MD     [email or web address]          LOCATION: SLEEP LAB FACILITY: [facility]  PHYSICIAN: Saliah Crisp A. Gerilyn Holland, M.D.   DATE OF STUDY: 10/02/2013  NOCTURNAL POLYSOMNOGRAM   REFERRING PHYSICIAN: Loran SentersSteven Luking.  INDICATIONS: This is a 43 year old male who presents with fatigue and snoring.  MEDICATIONS:  Prior to Admission medications   Medication Sig Start Date End Date Taking? Authorizing Provider  aspirin 81 MG tablet Take 81 mg by mouth Casey.      Historical Provider, MD  fenofibrate 160 MG tablet Take 1 tablet (160 mg total) by mouth Casey. 09/02/13   Merlyn AlbertWilliam S Luking, MD  metoprolol succinate (TOPROL XL) 50 MG 24 hr tablet Take 1 tablet (50 mg total) by mouth Casey. Take with or immediately following a meal. 08/22/12   Hadassah Paisobin N Bradley, PA-C  pravastatin (PRAVACHOL) 40 MG tablet Take 1 tablet (40 mg total) by mouth Casey. 08/10/13   Merlyn AlbertWilliam S Luking, MD  triamcinolone cream (KENALOG) 0.1 % prn 02/11/12   Historical Provider, MD      EPWORTH SLEEPINESS SCALE: 4.   BMI: 31.   ARCHITECTURAL SUMMARY: Total recording time was For 35 minutes. Sleep efficiency 41 %. Sleep latency 50 minutes. REM latency 0 minutes. Stage NI 18 %, N2 70 % and N3 12 % and REM sleep 0 %.    RESPIRATORY DATA:  Baseline oxygen saturation is 96 %. The lowest saturation is 89 %. The diagnostic AHI is 12. The RDI is 21. The REM AHI is 0. The supine AHI is 14. Most of the event occurred during the supine position.  LIMB MOVEMENT SUMMARY: PLM index 2.   ELECTROCARDIOGRAM SUMMARY: Average heart rate is 69 with no significant  dysrhythmias observed.   IMPRESSION:  1. Mild to moderate supine-related obstructive sleep apnea syndrome. A formal CPAP titration recorded is suggested. 2. Abnormal sleep architecture with poor sleep efficiency and absent REM sleep.  Thanks for this referral.  Casey Holland, M.D. Diplomat, Biomedical engineerAmerican Board of Sleep Medicine.

## 2013-10-16 ENCOUNTER — Encounter: Payer: Self-pay | Admitting: Family Medicine

## 2013-10-16 ENCOUNTER — Ambulatory Visit (INDEPENDENT_AMBULATORY_CARE_PROVIDER_SITE_OTHER): Payer: BC Managed Care – PPO | Admitting: Family Medicine

## 2013-10-16 VITALS — BP 128/82 | Ht 73.0 in | Wt 241.0 lb

## 2013-10-16 DIAGNOSIS — G4733 Obstructive sleep apnea (adult) (pediatric): Secondary | ICD-10-CM

## 2013-10-16 NOTE — Progress Notes (Signed)
   Subjective:    Patient ID: Casey Holland, male    DOB: 01/18/1971, 43 y.o.   MRN: [id number]  HPI Patient is here today to go over his sleep study results.   Patient had a suboptimum experience with sleep study.  His actual sleep was significantly limited.  Patient did have moderate sleep apnea during this time, however. His AHI was 14.  Patient has tremendous daytime drowsiness and fatigue chronically.  Very significant snoring along with apnea spells noted at home.  Review of Systems No chest pain back pain abdominal pain ROS otherwise negative    Objective:   Physical Exam  Alert blood pressure similar on repeat. HEENT normal. Lungs clear. Heart regular in rhythm. Impression progressive sleep apnea discussed. Definitely affecting patient during the day. Plan auto titration CPAP with setting at 10 and mask prescribed.      Assessment & Plan:  See above

## 2013-10-18 DIAGNOSIS — G4733 Obstructive sleep apnea (adult) (pediatric): Secondary | ICD-10-CM | POA: Insufficient documentation

## 2014-03-07 ENCOUNTER — Other Ambulatory Visit: Payer: Self-pay | Admitting: Family Medicine

## 2014-06-29 ENCOUNTER — Other Ambulatory Visit: Payer: Self-pay | Admitting: Family Medicine

## 2014-07-27 ENCOUNTER — Other Ambulatory Visit: Payer: Self-pay | Admitting: Family Medicine

## 2014-08-17 ENCOUNTER — Other Ambulatory Visit: Payer: Self-pay | Admitting: Family Medicine

## 2014-08-18 NOTE — Telephone Encounter (Signed)
Last seen 10/08/13 

## 2014-08-18 NOTE — Telephone Encounter (Signed)
Lip li v 6 mo ov, one mo worth of meds , ov first wk of jan

## 2014-08-31 ENCOUNTER — Telehealth: Payer: Self-pay | Admitting: Family Medicine

## 2014-08-31 DIAGNOSIS — Z79899 Other long term (current) drug therapy: Secondary | ICD-10-CM

## 2014-08-31 DIAGNOSIS — E785 Hyperlipidemia, unspecified: Secondary | ICD-10-CM

## 2014-08-31 DIAGNOSIS — R739 Hyperglycemia, unspecified: Secondary | ICD-10-CM

## 2014-08-31 NOTE — Telephone Encounter (Signed)
Blood work ordered in Epic. Patient notified. 

## 2014-08-31 NOTE — Telephone Encounter (Signed)
Ck that and order same plz

## 2014-08-31 NOTE — Telephone Encounter (Signed)
Pt would like his bw orders sent in   Please call when ready   Last labs done in march this year by [facility]   See attached to chart

## 2014-09-01 LAB — HEPATIC FUNCTION PANEL
ALBUMIN: 4.6 g/dL (ref 3.5–5.2)
ALK PHOS: 37 U/L — AB (ref 39–117)
ALT: 28 U/L (ref 0–53)
AST: 25 U/L (ref 0–37)
BILIRUBIN DIRECT: 0.2 mg/dL (ref 0.0–0.3)
BILIRUBIN TOTAL: 0.7 mg/dL (ref 0.2–1.2)
Indirect Bilirubin: 0.5 mg/dL (ref 0.2–1.2)
TOTAL PROTEIN: 6.9 g/dL (ref 6.0–8.3)

## 2014-09-01 LAB — LIPID PANEL
Cholesterol: 186 mg/dL (ref 0–200)
HDL: 31 mg/dL — ABNORMAL LOW (ref >39)
LDL Cholesterol: 117 mg/dL — ABNORMAL HIGH (ref 0–99)
Total CHOL/HDL Ratio: 6 Ratio
Triglycerides: 189 mg/dL — ABNORMAL HIGH (ref <150)
VLDL: 38 mg/dL (ref 0–40)

## 2014-09-01 LAB — BASIC METABOLIC PANEL
BUN: 24 mg/dL — ABNORMAL HIGH (ref 6–23)
CHLORIDE: 103 meq/L (ref 96–112)
CO2: 27 mEq/L (ref 19–32)
Calcium: 9.5 mg/dL (ref 8.4–10.5)
Creat: 1.05 mg/dL (ref 0.50–1.35)
Glucose, Bld: 99 mg/dL (ref 70–99)
POTASSIUM: 4.5 meq/L (ref 3.5–5.3)
Sodium: 138 mEq/L (ref 135–145)

## 2014-09-20 ENCOUNTER — Ambulatory Visit (INDEPENDENT_AMBULATORY_CARE_PROVIDER_SITE_OTHER): Payer: BC Managed Care – PPO | Admitting: Family Medicine

## 2014-09-20 ENCOUNTER — Encounter: Payer: Self-pay | Admitting: Family Medicine

## 2014-09-20 VITALS — BP 112/70 | Ht 74.0 in | Wt 248.2 lb

## 2014-09-20 DIAGNOSIS — Z0000 Encounter for general adult medical examination without abnormal findings: Secondary | ICD-10-CM

## 2014-09-20 DIAGNOSIS — K219 Gastro-esophageal reflux disease without esophagitis: Secondary | ICD-10-CM

## 2014-09-20 DIAGNOSIS — I421 Obstructive hypertrophic cardiomyopathy: Secondary | ICD-10-CM

## 2014-09-20 MED ORDER — FENOFIBRATE 160 MG PO TABS: ORAL_TABLET | ORAL | Status: DC

## 2014-09-20 MED ORDER — PRAVASTATIN SODIUM 40 MG PO TABS: ORAL_TABLET | ORAL | Status: DC

## 2014-09-20 MED ORDER — METOPROLOL SUCCINATE ER 50 MG PO TB24: 50.0000 mg | ORAL_TABLET | Freq: Every day | ORAL | Status: DC

## 2014-09-20 NOTE — Progress Notes (Signed)
Subjective:    Patient ID: Casey Holland, male    DOB: 09/01/1971, 44 y.o.   MRN: [id number]  HPI The patient comes in today for a wellness visit.    A review of their health history was completed.  A review of medications was also completed.  Any needed refills; yes   Eating habits: good  Falls/  MVA accidents in past few months: none  Regular exercise: rarely, heart restrictions  Specialist pt sees on regular basis: cardiologist  Preventative health issues were discussed.   Additional concerns: none   Results for orders placed or performed in visit on 08/31/14  Lipid panel  Result Value Ref Range   Cholesterol 186 0 - 200 mg/dL   Triglycerides 621189 (H) <150 mg/dL   HDL 31 (L) >30>39 mg/dL   Total CHOL/HDL Ratio 6.0 Ratio   VLDL 38 0 - 40 mg/dL   LDL Cholesterol 865117 (H) 0 - 99 mg/dL  Hepatic function panel  Result Value Ref Range   Total Bilirubin 0.7 0.2 - 1.2 mg/dL   Bilirubin, Direct 0.2 0.0 - 0.3 mg/dL   Indirect Bilirubin 0.5 0.2 - 1.2 mg/dL   Alkaline Phosphatase 37 (L) 39 - 117 U/L   AST 25 0 - 37 U/L   ALT 28 0 - 53 U/L   Total Protein 6.9 6.0 - 8.3 g/dL   Albumin 4.6 3.5 - 5.2 g/dL  Basic metabolic panel  Result Value Ref Range   Sodium 138 135 - 145 mEq/L   Potassium 4.5 3.5 - 5.3 mEq/L   Chloride 103 96 - 112 mEq/L   CO2 27 19 - 32 mEq/L   Glucose, Bld 99 70 - 99 mg/dL   BUN 24 (H) 6 - 23 mg/dL   Creat 7.841.05 6.960.50 - 2.951.35 mg/dL   Calcium 9.5 8.4 - 28.410.5 mg/dL  fa had hx of esoph cance  Using sleep apnea device, handling it overall well, still helping with the sleep. No snoring so spouse likes it.     Review of Systems  Constitutional: Negative for fever, activity change and appetite change.  HENT: Negative for congestion and rhinorrhea.   Eyes: Negative for discharge.  Respiratory: Negative for cough and wheezing.   Cardiovascular: Negative for chest pain.       See cardiac history currently minimal symptoms  Gastrointestinal: Negative for  vomiting, abdominal pain and blood in stool.  Genitourinary: Negative for frequency and difficulty urinating.  Musculoskeletal: Negative for neck pain.  Skin: Negative for rash.  Allergic/Immunologic: Negative for environmental allergies and food allergies.  Neurological: Negative for weakness and headaches.  Psychiatric/Behavioral: Negative for agitation.  All other systems reviewed and are negative.      Objective:   Physical Exam  Constitutional: He appears well-developed and well-nourished.  Obesity present  HENT:  Head: Normocephalic and atraumatic.  Right Ear: External ear normal.  Left Ear: External ear normal.  Nose: Nose normal.  Mouth/Throat: Oropharynx is clear and moist.  Eyes: EOM are normal. Pupils are equal, round, and reactive to light.  Neck: Normal range of motion. Neck supple. No thyromegaly present.  Cardiovascular: Normal rate, regular rhythm and normal heart sounds.   No murmur heard. Venous stasis changes and varicosities noted bilateral  Pulmonary/Chest: Effort normal and breath sounds normal. No respiratory distress. He has no wheezes.  Abdominal: Soft. Bowel sounds are normal. He exhibits no distension and no mass. There is no tenderness.  Genitourinary: Penis normal.  Musculoskeletal: Normal range of motion.  He exhibits no edema.  Lymphadenopathy:    He has no cervical adenopathy.  Neurological: He is alert. He exhibits normal muscle tone.  Skin: Skin is warm and dry. No erythema.  Psychiatric: He has a normal mood and affect. His behavior is normal. Judgment normal.  Vitals reviewed.         Assessment & Plan:  Impression #1 wellness exam #2 hyperlipidemia decent control #3 sleep apnea still using C Pap been benefiting. 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 Blood work reviewed. Medications refilled. Check every 6 months. WSL

## 2015-02-23 ENCOUNTER — Ambulatory Visit (INDEPENDENT_AMBULATORY_CARE_PROVIDER_SITE_OTHER): Payer: BC Managed Care – PPO | Admitting: Family Medicine

## 2015-02-23 ENCOUNTER — Encounter: Payer: Self-pay | Admitting: Family Medicine

## 2015-02-23 VITALS — BP 130/80 | Ht 74.0 in | Wt 241.2 lb

## 2015-02-23 DIAGNOSIS — K219 Gastro-esophageal reflux disease without esophagitis: Secondary | ICD-10-CM

## 2015-02-23 DIAGNOSIS — I5032 Chronic diastolic (congestive) heart failure: Secondary | ICD-10-CM

## 2015-02-23 DIAGNOSIS — Z79899 Other long term (current) drug therapy: Secondary | ICD-10-CM

## 2015-02-23 DIAGNOSIS — E785 Hyperlipidemia, unspecified: Secondary | ICD-10-CM | POA: Diagnosis not present

## 2015-02-23 MED ORDER — PRAVASTATIN SODIUM 40 MG PO TABS: ORAL_TABLET | ORAL | Status: DC

## 2015-02-23 MED ORDER — FENOFIBRATE 160 MG PO TABS: ORAL_TABLET | ORAL | Status: DC

## 2015-02-23 NOTE — Progress Notes (Signed)
   Subjective:    Patient ID: Casey Holland, male    DOB: 1971-07-21, 44 y.o.   MRN: [id number]  Hyperlipidemia This is a chronic problem. The current episode started more than 1 year ago. There are no known factors aggravating his hyperlipidemia. Treatments tried: Pravastatin, Fenofibrate. The current treatment provides moderate improvement of lipids. There are no compliance problems.  There are no known risk factors for coronary artery disease.   Patient states he has no concerns at this time.  Pt has stopped metoproplol, which he felt was causing depr and weight gain  255 has dropped 15 pounds weight   Noticing some inmpr symptoms  Taking thee lipid meds   Shoots for pulse rate of 120 or so,   With excess work, pt develops bradycardia with intervention, bp drops significantly  Eating sometimes right and sometimes not so right, sas seeing a nutr which requires a "crazy diet of six times per day  Patient notes periods of mental stress with his chronic serious cardiac concern. Of good note cardiologist repeated an echo and stated that the heart thickness was stable.  The been adjusting medications trying to find a beta blocker that he can handle best.  Patient reports bloating sensation in his abdomen. He is been taking occasional Lasix of his brothers. Notes no swollen ankles or feet. Bloating occurs in abdomen. Patient has gained considerable amount of weight in the past couple years. Not able to exercise as well with this cardiac troubles and admits to dietary indiscretion  Review of Systems No loss of consciousness no headache no chest pain no back pain no change in bowel habits    Objective:   Physical Exam   Alert vitals stable lungs clear. Heart regular in rhythm. Positive murmur noted abdomen benign no discrete tenderness. No rebound no guarding extremities trace edema at most     Assessment & Plan:  Impression 1 hyperlipidemia status uncertain #2 cardiac disease  discussed great length #3 mental status challenges at times with chronic stress from illness #4 fluid concerns regarding abdomen. Patient educated this is likely predominantly adipose tissue. I would definitely not recommend prescribing more Lasix for this plan appropriate blood work diet exercise discussed 25 minutes spent most in discussion WSL

## 2015-02-24 ENCOUNTER — Encounter: Payer: Self-pay | Admitting: Family Medicine

## 2015-02-24 LAB — BASIC METABOLIC PANEL
BUN/Creatinine Ratio: 19 (ref 9–20)
BUN: 19 mg/dL (ref 6–24)
CALCIUM: 10 mg/dL (ref 8.7–10.2)
CO2: 21 mmol/L (ref 18–29)
CREATININE: 1 mg/dL (ref 0.76–1.27)
Chloride: 100 mmol/L (ref 97–108)
GFR calc Af Amer: 105 mL/min/{1.73_m2} (ref >59)
GFR, EST NON AFRICAN AMERICAN: 91 mL/min/{1.73_m2} (ref >59)
GLUCOSE: 104 mg/dL — AB (ref 65–99)
Potassium: 5.3 mmol/L — ABNORMAL HIGH (ref 3.5–5.2)
Sodium: 142 mmol/L (ref 134–144)

## 2015-02-24 LAB — LIPID PANEL
CHOL/HDL RATIO: 5.2 ratio — AB (ref 0.0–5.0)
Cholesterol, Total: 177 mg/dL (ref 100–199)
HDL: 34 mg/dL — AB (ref >39)
LDL CALC: 111 mg/dL — AB (ref 0–99)
TRIGLYCERIDES: 159 mg/dL — AB (ref 0–149)
VLDL Cholesterol Cal: 32 mg/dL (ref 5–40)

## 2015-02-24 LAB — HEPATIC FUNCTION PANEL
ALT: 37 IU/L (ref 0–44)
AST: 32 IU/L (ref 0–40)
Albumin: 4.9 g/dL (ref 3.5–5.5)
Alkaline Phosphatase: 40 IU/L (ref 39–117)
BILIRUBIN TOTAL: 0.9 mg/dL (ref 0.0–1.2)
Bilirubin, Direct: 0.22 mg/dL (ref 0.00–0.40)
Total Protein: 7.5 g/dL (ref 6.0–8.5)

## 2015-08-23 ENCOUNTER — Other Ambulatory Visit: Payer: Self-pay | Admitting: Family Medicine

## 2015-09-20 ENCOUNTER — Telehealth: Payer: Self-pay | Admitting: Family Medicine

## 2015-09-20 DIAGNOSIS — E785 Hyperlipidemia, unspecified: Secondary | ICD-10-CM

## 2015-09-20 DIAGNOSIS — Z139 Encounter for screening, unspecified: Secondary | ICD-10-CM

## 2015-09-20 NOTE — Telephone Encounter (Signed)
Pt is requesting labs to be sent over for his upcoming appt. Last labs per epic were: lipid,hepatic,and bmp on 02/23/15

## 2015-09-20 NOTE — Telephone Encounter (Signed)
Lip liv glu 

## 2015-09-21 ENCOUNTER — Encounter: Payer: BC Managed Care – PPO | Admitting: Family Medicine

## 2015-09-21 NOTE — Telephone Encounter (Signed)
Patient notified

## 2015-09-21 NOTE — Telephone Encounter (Signed)
[facility] (Labs ordered)

## 2015-09-24 LAB — HEPATIC FUNCTION PANEL
ALBUMIN: 4.7 g/dL (ref 3.5–5.5)
ALT: 34 IU/L (ref 0–44)
AST: 32 IU/L (ref 0–40)
Alkaline Phosphatase: 44 IU/L (ref 39–117)
BILIRUBIN TOTAL: 1.1 mg/dL (ref 0.0–1.2)
Bilirubin, Direct: 0.32 mg/dL (ref 0.00–0.40)
TOTAL PROTEIN: 7.1 g/dL (ref 6.0–8.5)

## 2015-09-24 LAB — LIPID PANEL
CHOL/HDL RATIO: 5.2 ratio — AB (ref 0.0–5.0)
Cholesterol, Total: 178 mg/dL (ref 100–199)
HDL: 34 mg/dL — AB (ref >39)
LDL Calculated: 110 mg/dL — ABNORMAL HIGH (ref 0–99)
Triglycerides: 172 mg/dL — ABNORMAL HIGH (ref 0–149)
VLDL Cholesterol Cal: 34 mg/dL (ref 5–40)

## 2015-09-24 LAB — GLUCOSE, RANDOM: GLUCOSE: 127 mg/dL — AB (ref 65–99)

## 2015-09-26 ENCOUNTER — Encounter: Payer: Self-pay | Admitting: Family Medicine

## 2015-09-26 ENCOUNTER — Ambulatory Visit (INDEPENDENT_AMBULATORY_CARE_PROVIDER_SITE_OTHER): Payer: BC Managed Care – PPO | Admitting: Family Medicine

## 2015-09-26 VITALS — BP 102/70 | Ht 74.0 in | Wt 247.5 lb

## 2015-09-26 DIAGNOSIS — E785 Hyperlipidemia, unspecified: Secondary | ICD-10-CM | POA: Diagnosis not present

## 2015-09-26 DIAGNOSIS — R7303 Prediabetes: Secondary | ICD-10-CM

## 2015-09-26 DIAGNOSIS — Z0000 Encounter for general adult medical examination without abnormal findings: Secondary | ICD-10-CM

## 2015-09-26 LAB — POCT GLYCOSYLATED HEMOGLOBIN (HGB A1C): HEMOGLOBIN A1C: 5.2

## 2015-09-26 MED ORDER — PRAVASTATIN SODIUM 40 MG PO TABS: 40.0000 mg | ORAL_TABLET | Freq: Every day | ORAL | Status: DC

## 2015-09-26 MED ORDER — FENOFIBRATE 160 MG PO TABS: ORAL_TABLET | ORAL | Status: DC

## 2015-09-26 NOTE — Progress Notes (Signed)
   Subjective:    Patient ID: Casey Holland, male    DOB: May 15, 1971, 45 y.o.   MRN: [id number]  HPI The patient comes in today for a wellness visit.  Walking but not major cardio,  Due to see card in february, more prone to get toired and light headed with exercise.   A review of their health history was completed.  A review of medications was also completed.  Any needed refills: none  Eating habits: good  Falls/  MVA accidents in past few months: none  Regular exercise: yes  Specialist pt sees on regular basis: Cardiologist  Preventative health issues were discussed.   Additional concerns: none  Chol trigly med compliant  Does not miss dose of meds  Took the meds regulaly, medications reviewed today  fam hx of diab none pt is aware of,  A bit of sugar in drinks, occas cookie but no major sugar intake  Flu shot thru the schoo   Results for orders placed or performed in visit on 09/26/15  POCT glycosylated hemoglobin (Hb A1C)  Result Value Ref Range   Hemoglobin A1C 5.2     Review of Systems  Constitutional: Positive for fatigue. Negative for fever, activity change and appetite change.  HENT: Negative for congestion and rhinorrhea.   Eyes: Negative for discharge.  Respiratory: Negative for cough and wheezing.   Cardiovascular: Negative for chest pain.  Gastrointestinal: Negative for vomiting, abdominal pain and blood in stool.  Genitourinary: Negative for frequency and difficulty urinating.  Musculoskeletal: Negative for neck pain.  Skin: Negative for rash.  Allergic/Immunologic: Negative for environmental allergies and food allergies.  Neurological: Negative for weakness and headaches.  Psychiatric/Behavioral: Negative for agitation.       Objective:   Physical Exam  Constitutional: He appears well-developed and well-nourished.  HENT:  Head: Normocephalic and atraumatic.  Right Ear: External ear normal.  Left Ear: External ear normal.  Nose: Nose  normal.  Mouth/Throat: Oropharynx is clear and moist.  Eyes: EOM are normal. Pupils are equal, round, and reactive to light.  Neck: Normal range of motion. Neck supple. No thyromegaly present.  Cardiovascular: Normal rate, regular rhythm and normal heart sounds.   No murmur heard. Ant scar chest, holosystolic murmur  Pulmonary/Chest: Effort normal and breath sounds normal. No respiratory distress. He has no wheezes.  Abdominal: Soft. Bowel sounds are normal. He exhibits no distension and no mass. There is no tenderness.  Genitourinary: Penis normal.  Musculoskeletal: Normal range of motion. He exhibits no edema.  Lymphadenopathy:    He has no cervical adenopathy.  Neurological: He is alert. He exhibits normal muscle tone.  Skin: Skin is warm and dry. No erythema.  Psychiatric: He has a normal mood and affect. His behavior is normal. Judgment normal.  Vitals reviewed.         Assessment & Plan:  Impression 1 well until exam #2 chronic cardiac abnormality discussed#3 hyperlipidemia triglycerides and LDL in much better control discussed #4 hyperglycemia discuss A1c done fortunately 5.2 plan fasting sugar 127 does reveal impaired fasting glucose

## 2015-09-26 NOTE — Patient Instructions (Signed)
Prediabetes Eating Plan Prediabetes--also called impaired glucose tolerance or impaired fasting glucose--is a condition that causes blood sugar (blood glucose) levels to be higher than normal. Following a healthy diet can help to keep prediabetes under control. It can also help to lower the risk of type 2 diabetes and heart disease, which are increased in people who have prediabetes. Along with regular exercise, a healthy diet:  Promotes weight loss.  Helps to control blood sugar levels.  Helps to improve the way that the body uses insulin. WHAT DO I NEED TO KNOW ABOUT THIS EATING PLAN?  Use the glycemic index (GI) to plan your meals. The index tells you how quickly a food will raise your blood sugar. Choose low-GI foods. These foods take a longer time to raise blood sugar.  Pay close attention to the amount of carbohydrates in the food that you eat. Carbohydrates increase blood sugar levels.  Keep track of how many calories you take in. Eating the right amount of calories will help you to achieve a healthy weight. Losing about 7 percent of your starting weight can help to prevent type 2 diabetes.  You may want to follow a Mediterranean diet. This diet includes a lot of vegetables, lean meats or fish, whole grains, fruits, and healthy oils and fats. WHAT FOODS CAN I EAT? Grains Whole grains, such as whole-wheat or whole-grain breads, crackers, cereals, and pasta. Unsweetened oatmeal. Bulgur. Barley. Quinoa. Brown rice. Corn or whole-wheat flour tortillas or taco shells. Vegetables Lettuce. Spinach. Peas. Beets. Cauliflower. Cabbage. Broccoli. Carrots. Tomatoes. Squash. Eggplant. Herbs. Peppers. Onions. Cucumbers. Brussels sprouts. Fruits Berries. Bananas. Apples. Oranges. Grapes. Papaya. Mango. Pomegranate. Kiwi. Grapefruit. Cherries. Meats and Other Protein Sources Seafood. Lean meats, such as chicken and turkey or lean cuts of pork and beef. Tofu. Eggs. Nuts. Beans. Dairy Low-fat or  fat-free dairy products, such as yogurt, cottage cheese, and cheese. Beverages Water. Tea. Coffee. Sugar-free or diet soda. Seltzer water. Milk. Milk alternatives, such as soy or almond milk. Condiments Mustard. Relish. Low-fat, low-sugar ketchup. Low-fat, low-sugar barbecue sauce. Low-fat or fat-free mayonnaise. Sweets and Desserts Sugar-free or low-fat pudding. Sugar-free or low-fat ice cream and other frozen treats. Fats and Oils Avocado. Walnuts. Olive oil. The items listed above may not be a complete list of recommended foods or beverages. Contact your dietitian for more options.  WHAT FOODS ARE NOT RECOMMENDED? Grains Refined white flour and flour products, such as bread, pasta, snack foods, and cereals. Beverages Sweetened drinks, such as sweet iced tea and soda. Sweets and Desserts Baked goods, such as cake, cupcakes, pastries, cookies, and cheesecake. The items listed above may not be a complete list of foods and beverages to avoid. Contact your dietitian for more information.   This information is not intended to replace advice given to you by your health care provider. Make sure you discuss any questions you have with your health care provider.   Document Released: 01/04/2015 Document Reviewed: 01/04/2015 Elsevier Interactive Patient Education 2016 [employer].  

## 2015-10-17 ENCOUNTER — Ambulatory Visit (INDEPENDENT_AMBULATORY_CARE_PROVIDER_SITE_OTHER): Payer: BC Managed Care – PPO | Admitting: Nurse Practitioner

## 2015-10-17 ENCOUNTER — Emergency Department (HOSPITAL_COMMUNITY): Payer: BC Managed Care – PPO

## 2015-10-17 ENCOUNTER — Encounter: Payer: Self-pay | Admitting: Nurse Practitioner

## 2015-10-17 ENCOUNTER — Inpatient Hospital Stay (HOSPITAL_COMMUNITY)
Admission: EM | Admit: 2015-10-17 | Discharge: 2015-10-21 | DRG: 309 | Disposition: A | Discharge status: Home / Self Care | Payer: BC Managed Care – PPO | Attending: Family Medicine | Admitting: Family Medicine

## 2015-10-17 ENCOUNTER — Encounter (HOSPITAL_COMMUNITY): Payer: Self-pay | Admitting: Emergency Medicine

## 2015-10-17 VITALS — BP 112/74 | Temp 98.6°F | Ht 74.0 in | Wt 254.1 lb

## 2015-10-17 DIAGNOSIS — F329 Major depressive disorder, single episode, unspecified: Secondary | ICD-10-CM | POA: Diagnosis present

## 2015-10-17 DIAGNOSIS — I4891 Unspecified atrial fibrillation: Secondary | ICD-10-CM | POA: Diagnosis present

## 2015-10-17 DIAGNOSIS — E785 Hyperlipidemia, unspecified: Secondary | ICD-10-CM | POA: Diagnosis present

## 2015-10-17 DIAGNOSIS — Z8679 Personal history of other diseases of the circulatory system: Secondary | ICD-10-CM

## 2015-10-17 DIAGNOSIS — F419 Anxiety disorder, unspecified: Secondary | ICD-10-CM | POA: Diagnosis present

## 2015-10-17 DIAGNOSIS — Z7982 Long term (current) use of aspirin: Secondary | ICD-10-CM

## 2015-10-17 DIAGNOSIS — N179 Acute kidney failure, unspecified: Secondary | ICD-10-CM | POA: Diagnosis present

## 2015-10-17 DIAGNOSIS — G4733 Obstructive sleep apnea (adult) (pediatric): Secondary | ICD-10-CM | POA: Diagnosis present

## 2015-10-17 DIAGNOSIS — I5032 Chronic diastolic (congestive) heart failure: Secondary | ICD-10-CM | POA: Diagnosis present

## 2015-10-17 DIAGNOSIS — I422 Other hypertrophic cardiomyopathy: Secondary | ICD-10-CM | POA: Diagnosis present

## 2015-10-17 DIAGNOSIS — I4892 Unspecified atrial flutter: Secondary | ICD-10-CM | POA: Diagnosis present

## 2015-10-17 DIAGNOSIS — I499 Cardiac arrhythmia, unspecified: Secondary | ICD-10-CM | POA: Diagnosis not present

## 2015-10-17 DIAGNOSIS — I248 Other forms of acute ischemic heart disease: Secondary | ICD-10-CM | POA: Diagnosis present

## 2015-10-17 DIAGNOSIS — J45909 Unspecified asthma, uncomplicated: Secondary | ICD-10-CM | POA: Diagnosis present

## 2015-10-17 DIAGNOSIS — G4700 Insomnia, unspecified: Secondary | ICD-10-CM | POA: Diagnosis present

## 2015-10-17 LAB — CBC WITH DIFFERENTIAL/PLATELET
BASOS ABS: 0 10*3/uL (ref 0.0–0.1)
BASOS PCT: 0 %
EOS PCT: 2 %
Eosinophils Absolute: 0.2 10*3/uL (ref 0.0–0.7)
HCT: 48.9 % (ref 39.0–52.0)
Hemoglobin: 16.1 g/dL (ref 13.0–17.0)
Lymphocytes Relative: 33 %
Lymphs Abs: 2.6 10*3/uL (ref 0.7–4.0)
MCH: 31 pg (ref 26.0–34.0)
MCHC: 32.9 g/dL (ref 30.0–36.0)
MCV: 94 fL (ref 78.0–100.0)
MONO ABS: 1 10*3/uL (ref 0.1–1.0)
Monocytes Relative: 13 %
Neutro Abs: 3.9 10*3/uL (ref 1.7–7.7)
Neutrophils Relative %: 52 %
PLATELETS: 236 10*3/uL (ref 150–400)
RBC: 5.2 MIL/uL (ref 4.22–5.81)
RDW: 12.2 % (ref 11.5–15.5)
WBC: 7.7 10*3/uL (ref 4.0–10.5)

## 2015-10-17 LAB — PROTIME-INR
INR: 1.18 (ref 0.00–1.49)
Prothrombin Time: 15.2 seconds (ref 11.6–15.2)

## 2015-10-17 LAB — COMPREHENSIVE METABOLIC PANEL
ALBUMIN: 4.7 g/dL (ref 3.5–5.0)
ALT: 29 U/L (ref 17–63)
ANION GAP: 9 (ref 5–15)
AST: 34 U/L (ref 15–41)
Alkaline Phosphatase: 33 U/L — ABNORMAL LOW (ref 38–126)
BUN: 24 mg/dL — AB (ref 6–20)
CHLORIDE: 102 mmol/L (ref 101–111)
CO2: 29 mmol/L (ref 22–32)
Calcium: 10.2 mg/dL (ref 8.9–10.3)
Creatinine, Ser: 1.3 mg/dL — ABNORMAL HIGH (ref 0.61–1.24)
GFR calc Af Amer: >60 mL/min (ref >60)
Glucose, Bld: 123 mg/dL — ABNORMAL HIGH (ref 65–99)
POTASSIUM: 3.9 mmol/L (ref 3.5–5.1)
Sodium: 140 mmol/L (ref 135–145)
Total Bilirubin: 1 mg/dL (ref 0.3–1.2)
Total Protein: 7.6 g/dL (ref 6.5–8.1)

## 2015-10-17 LAB — APTT: aPTT: 30 seconds (ref 24–37)

## 2015-10-17 LAB — I-STAT TROPONIN, ED: TROPONIN I, POC: 0.16 ng/mL — AB (ref 0.00–0.08)

## 2015-10-17 LAB — MAGNESIUM: Magnesium: 2 mg/dL (ref 1.7–2.4)

## 2015-10-17 LAB — MRSA PCR SCREENING: MRSA by PCR: NEGATIVE

## 2015-10-17 MED ORDER — SODIUM CHLORIDE 0.9% FLUSH: 3.0000 mL | INTRAVENOUS | Status: DC | PRN

## 2015-10-17 MED ORDER — ONDANSETRON HCL 4 MG/2ML IJ SOLN: 4.0000 mg | Freq: Four times a day (QID) | INTRAMUSCULAR | Status: DC | PRN

## 2015-10-17 MED ORDER — DILTIAZEM HCL 25 MG/5ML IV SOLN
10.0000 mg | Freq: Once | INTRAVENOUS | Status: AC
  Administered 2015-10-17: 10 mg via INTRAVENOUS
  Filled 2015-10-17: qty 5

## 2015-10-17 MED ORDER — DILTIAZEM HCL 100 MG IV SOLR
5.0000 mg/h | Freq: Once | INTRAVENOUS | Status: AC
  Administered 2015-10-17: 5 mg/h via INTRAVENOUS
  Filled 2015-10-17: qty 100

## 2015-10-17 MED ORDER — HEPARIN (PORCINE) IN NACL 100-0.45 UNIT/ML-% IJ SOLN
1600.0000 [IU]/h | INTRAMUSCULAR | Status: DC
  Administered 2015-10-17: 1600 [IU]/h via INTRAVENOUS
  Filled 2015-10-17 (×2): qty 250

## 2015-10-17 MED ORDER — HEPARIN BOLUS VIA INFUSION
4000.0000 [IU] | Freq: Once | INTRAVENOUS | Status: AC
  Administered 2015-10-17: 4000 [IU] via INTRAVENOUS
  Filled 2015-10-17: qty 4000

## 2015-10-17 MED ORDER — NEBIVOLOL HCL 2.5 MG PO TABS
5.0000 mg | ORAL_TABLET | Freq: Every day | ORAL | Status: DC
  Administered 2015-10-18: 5 mg via ORAL
  Filled 2015-10-17 (×2): qty 1

## 2015-10-17 MED ORDER — ACETAMINOPHEN 325 MG PO TABS: 650.0000 mg | ORAL_TABLET | ORAL | Status: DC | PRN

## 2015-10-17 MED ORDER — PRAVASTATIN SODIUM 40 MG PO TABS
40.0000 mg | ORAL_TABLET | Freq: Every day | ORAL | Status: DC
  Administered 2015-10-17 – 2015-10-20 (×4): 40 mg via ORAL
  Filled 2015-10-17 (×4): qty 1

## 2015-10-17 MED ORDER — DILTIAZEM HCL 100 MG IV SOLR
5.0000 mg/h | INTRAVENOUS | Status: DC
  Filled 2015-10-17: qty 100

## 2015-10-17 MED ORDER — SODIUM CHLORIDE 0.9 % IV SOLN: 250.0000 mL | INTRAVENOUS | Status: DC | PRN

## 2015-10-17 MED ORDER — FENOFIBRATE 160 MG PO TABS
160.0000 mg | ORAL_TABLET | Freq: Every day | ORAL | Status: DC
  Administered 2015-10-18 – 2015-10-21 (×4): 160 mg via ORAL
  Filled 2015-10-17 (×7): qty 1

## 2015-10-17 MED ORDER — SODIUM CHLORIDE 0.9% FLUSH
3.0000 mL | Freq: Two times a day (BID) | INTRAVENOUS | Status: DC
  Administered 2015-10-17 – 2015-10-20 (×4): 3 mL via INTRAVENOUS

## 2015-10-17 NOTE — Progress Notes (Signed)
ANTICOAGULATION CONSULT NOTE - Initial Consult  Pharmacy Consult for Heparin Indication: atrial fibrillation  Allergies  Allergen Reactions  . Vicodin [Hydrocodone-Acetaminophen] Nausea And Vomiting  . Latex Rash    Patient Measurements: Height:  (185.4 cm) Weight: 249 lb 12.5 oz (113.3 kg) IBW/kg (Calculated) : 79.9 HEPARIN DW (KG): 103.9   Vital Signs: Temp: 98.6 F (37 C) (02/13 1901) Temp Source: Oral (02/13 1901) BP: 87/74 mmHg (02/13 1901) Pulse Rate: 90 (02/13 1901)  Labs:  Recent Labs  10/17/15 1556  HGB 16.1  HCT 48.9  PLT 236  CREATININE 1.30*    Estimated Creatinine Clearance: 95.7 mL/min (by C-G formula based on Cr of 1.3).   Medical History: Past Medical History  Diagnosis Date  . Cardiomyopathy, hypertrophic nonobstructive (HCC)     subaortic valve gradient of with recent congestive heart failure and volume overload, now improved with diuretics  . 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 Asthma     Medications:  Prescriptions prior to admission  Medication Sig Dispense Refill Last Dose  . aspirin EC 81 MG tablet Take 81 mg by mouth daily.   10/17/2015 at Unknown time  . BYSTOLIC 5 MG tablet Take 5 mg by mouth daily.    10/17/2015 at Unknown time  . fenofibrate 160 MG tablet TAKE (1) TABLET BY MOUTH ONCE DAILY. 30 tablet 5 10/16/2015 at Unknown time  . pravastatin (PRAVACHOL) 40 MG tablet Take 1 tablet (40 mg total) by mouth daily. 30 tablet 5 10/16/2015 at Unknown time  . triamcinolone cream (KENALOG) 0.1 % Apply 1 application topically daily as needed.    unknown at Unknown time    Assessment:  Okay for Protocol, baseline anti coag labs pending.  No bleeding noted.  Goal of Therapy:  Heparin level 0.3-0.7 units/ml Monitor platelets by anticoagulation protocol: Yes   Plan:  Give 4000 units bolus x 1 Start heparin infusion at 1600 units/hr Check anti-Xa level in 6-8 hours and daily while on  heparin Continue to monitor H&H and platelets  Mady Gemma 10/17/2015,7:21 PM

## 2015-10-17 NOTE — ED Notes (Addendum)
Pt reports no chest pain, has a history of Cardiomyopathy, and experienced dizziness today. He is followed by Dr Modesto Charon at [facility]. 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

## 2015-10-17 NOTE — ED Provider Notes (Signed)
CSN: [id number]     Arrival date & time 10/17/15  1528 History   First MD Initiated Contact with Patient 10/17/15 1538     Chief Complaint  Patient presents with  . Abnormal ECG     (Consider location/radiation/quality/duration/timing/severity/associated sxs/prior Treatment) Patient is a 45 y.o. male presenting with palpitations. The history is provided by the patient (Patient states he has been feeling weak feeling his heart. Funny since Friday).  Palpitations Palpitations quality:  Irregular Onset quality:  Unable to specify Timing:  Constant Progression:  Unchanged Chronicity:  New Context: not anxiety   Associated symptoms: weakness   Associated symptoms: no back pain, no chest pain and no cough     Past Medical History  Diagnosis Date  . Cardiomyopathy, hypertrophic nonobstructive (HCC)     subaortic valve gradient of with recent congestive heart failure and volume overload, now improved with diuretics  . 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 Asthma    Past Surgical History  Procedure Laterality Date  . Cardiac surgery  2010   No family history on file. Social History  Substance Use Topics  . Smoking status: Never Smoker   . Smokeless tobacco: None  . Alcohol Use: No    Review of Systems  Constitutional: Negative for appetite change and fatigue.  HENT: Negative for congestion, ear discharge and sinus pressure.   Eyes: Negative for discharge.  Respiratory: Negative for cough.   Cardiovascular: Positive for palpitations. Negative for chest pain.  Gastrointestinal: Negative for abdominal pain and diarrhea.  Genitourinary: Negative for frequency and hematuria.  Musculoskeletal: Negative for back pain.  Skin: Negative for rash.  Neurological: Positive for weakness. Negative for seizures and headaches.  Psychiatric/Behavioral: Negative for hallucinations.      Allergies  Vicodin and Latex  Home Medications   Prior to Admission  medications   Medication Sig Start Date End Date Taking? Authorizing Provider  aspirin EC 81 MG tablet Take 81 mg by mouth daily.   Yes Historical Provider, MD  BYSTOLIC 5 MG tablet Take 5 mg by mouth daily.  02/18/15  Yes Historical Provider, MD  fenofibrate 160 MG tablet TAKE (1) TABLET BY MOUTH ONCE DAILY. 09/26/15  Yes Merlyn Albert, MD  pravastatin (PRAVACHOL) 40 MG tablet Take 1 tablet (40 mg total) by mouth daily. 09/26/15  Yes Merlyn Albert, MD  triamcinolone cream (KENALOG) 0.1 % Apply 1 application topically daily as needed.  02/11/12  Yes Historical Provider, MD   BP 102/85 mmHg  Pulse 54  Temp(Src) 97.7 F (36.5 C) (Oral)  Resp 11  SpO2 97% Physical Exam  Constitutional: He is oriented to person, place, and time. He appears well-developed.  HENT:  Head: Normocephalic.  Eyes: Conjunctivae and EOM are normal. No scleral icterus.  Neck: Neck supple. No thyromegaly present.  Cardiovascular: Exam reveals no gallop and no friction rub.   No murmur heard. Irregular rapid rate  Pulmonary/Chest: No stridor. He has no wheezes. He has no rales. He exhibits no tenderness.  Abdominal: He exhibits no distension. There is no tenderness. There is no rebound.  Musculoskeletal: Normal range of motion. He exhibits no edema.  Lymphadenopathy:    He has no cervical adenopathy.  Neurological: He is oriented to person, place, and time. He exhibits normal muscle tone. Coordination normal.  Skin: No rash noted. No erythema.  Psychiatric: He has a normal mood and affect. His behavior is normal.    ED Course  Procedures (including critical care time) Labs Review Labs Reviewed  COMPREHENSIVE METABOLIC PANEL - Abnormal; Notable for the following:    Glucose, Bld 123 (*)    BUN 24 (*)    Creatinine, Ser 1.30 (*)    Alkaline Phosphatase 33 (*)    All other components within normal limits  I-STAT TROPOININ, ED - Abnormal; Notable for the following:    Troponin i, poc 0.16 (*)    All other  components within normal limits  CBC WITH DIFFERENTIAL/PLATELET    Imaging Review Dg Chest Portable 1 View  10/17/2015  CLINICAL DATA:  45 year old male with dizziness and shortness of breath for the past 3 days. Past medical history includes cardiomyopathy, heart failure and heart surgery in 2010. EXAM: PORTABLE CHEST 1 VIEW COMPARISON:  Prior chest x-ray 12/06/2006 FINDINGS: Progressive cardiomegaly with enlargement of the left atrial appendage. Patient is status post median sternotomy. There is a fracture of the third sternal wire from the top. The fracture does not appear displaced. The remaining sternal wires are intact. Mild vascular congestion without overt edema. No pneumothorax or pleural effusion. No acute osseous abnormality. IMPRESSION: 1. Progressive cardiomegaly compared to 12/06/2006. 2. Probable enlargement of the left atrial appendage. Does the patient have a clinical history of mitral valve pathology? 3. Surgical changes of prior median sternotomy. 4. Mild vascular congestion without overt edema. Electronically Signed   By: Malachy Moan M.D.   On: 10/17/2015 16:41   I have personally reviewed and evaluated these images and lab results as part of my medical decision-making.   EKG Interpretation   Date/Time:  Monday October 17 2015 15:54:04 EST Ventricular Rate:  126 PR Interval:    QRS Duration: 169 QT Interval:  395 QTC Calculation: 572 R Axis:   -59 Text Interpretation:  Atrial fibrillation Ventricular premature complex  Left bundle branch block Confirmed by Yailen Zemaitis  MD, Zorianna Taliaferro [phone]) on  10/17/2015 4:57:54 PM     CRITICAL CARE Performed by: Aedin Jeansonne L Total critical care time: 40 minutes Critical care time was exclusive of separately billable procedures and treating other patients. Critical care was necessary to treat or prevent imminent or life-threatening deterioration. Critical care was time spent personally by me on the following activities: development  of treatment plan with patient and/or surrogate as well as nursing, discussions with consultants, evaluation of patient's response to treatment, examination of patient, obtaining history from patient or surrogate, ordering and performing treatments and interventions, ordering and review of laboratory studies, ordering and review of radiographic studies, pulse oximetry and re-evaluation of patient's condition.  MDM   Final diagnoses:  Atrial fibrillation with rapid ventricular response (HCC)    Patient with rapid atrial fib flutter rate control with Cardizem drip. I spoke with cardiology they will consult on the patient morning they would like to get an echo and continue the drip for rate control.   The chart was scribed for me under my direct supervision.  I personally performed the history, physical, and medical decision making and all procedures in the evaluation of this patient.Bethann Berkshire, MD 10/17/15 1800

## 2015-10-17 NOTE — H&P (Signed)
[facility] History and Physical  HAWKE VILLALPANDO RUE:[id number] DOB: 03-03-1971 DOA: 10/17/2015  Referring physician: Dr. Estell Harpin PCP: Lubertha South, MD   Chief Complaint: Palpitations   HPI: Casey Holland is a 45 y.o. male with hx of HOCM, class II-III CHF and asthma presenting with dizziness and palpitations for 3-4 days. In ED found to have afib/ flutter with rapid ventricular rate about 130.  Rx with IV diltiazem in the ED and ventricular rate is down to 80's now.  Per ER MD, cardiology recommending admit here w echo and card consult in am.    Patient brother died w complications after a [facility] procedure related to [facility].  Father had the disease as well and had numerous hospitalizations according to patient. He is now deceased.  Patient had difficulty w exercise and wasn't allowed to play sports growing up. Had "Class IV chf" and eventually underwent myectomy surgery at the [facility] in [place] about 6 yrs ago. Had "afib" postop for several days but not since.  No hx of CHF since his surgery.  Walks daily in the neighborhood.  Has two kids , boy and girl age 71 and 28.    No SOB, CP, no hemoptysis. No hx bleeding problems. Never been on blood thinners. No abd pain, n/v/d  no joint pains or rash.  No HA, focla weakness.   Where does patient live home Can patient participate in ADLs? yes  Past Medical History  Past Medical History  Diagnosis Date  . Cardiomyopathy, hypertrophic nonobstructive (HCC)     subaortic valve gradient of with recent congestive heart failure and volume overload, now improved with diuretics  . 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 Asthma    Past Surgical History  Past Surgical History  Procedure Laterality Date  . Cardiac surgery  2010   Family History No family history on file. Social History  reports that he has never smoked. He does not have any smokeless tobacco history on file. He reports that he does not drink alcohol. His  drug history is not on file. Allergies  Allergies  Allergen Reactions  . Vicodin [Hydrocodone-Acetaminophen] Nausea And Vomiting  . Latex Rash   Home medications Prior to Admission medications   Medication Sig Start Date End Date Taking? Authorizing Provider  aspirin EC 81 MG tablet Take 81 mg by mouth daily.   Yes Historical Provider, MD  BYSTOLIC 5 MG tablet Take 5 mg by mouth daily.  02/18/15  Yes Historical Provider, MD  fenofibrate 160 MG tablet TAKE (1) TABLET BY MOUTH ONCE DAILY. 09/26/15  Yes Merlyn Albert, MD  pravastatin (PRAVACHOL) 40 MG tablet Take 1 tablet (40 mg total) by mouth daily. 09/26/15  Yes Merlyn Albert, MD  triamcinolone cream (KENALOG) 0.1 % Apply 1 application topically daily as needed.  02/11/12  Yes Historical Provider, MD   Liver Function Tests  Recent Labs Lab 10/17/15 1556  AST 34  ALT 29  ALKPHOS 33*  BILITOT 1.0  PROT 7.6  ALBUMIN 4.7   No results for input(s): LIPASE, AMYLASE in the last 168 hours. CBC  Recent Labs Lab 10/17/15 1556  WBC 7.7  NEUTROABS 3.9  HGB 16.1  HCT 48.9  MCV 94.0  PLT 236   Basic Metabolic Panel  Recent Labs Lab 10/17/15 1556  NA 140  K 3.9  CL 102  CO2 29  GLUCOSE 123*  BUN 24*  CREATININE 1.30*  CALCIUM  10.2     Filed Vitals:   10/17/15 1630 10/17/15 1700 10/17/15 1730 10/17/15 1800  BP: 109/81 102/85  122/91  Pulse: 86 71 54 121  Temp:      TempSrc:      Resp: SpO2: 99% 97% 97% 97%   Exam: Alert no distress, calm No rash, cyanosis or gangrene Sclera anicteric, throat clear No jvd Chest clear bilat Cor Irreg irreg rhythm no MRG Abd obese soft ntnd no mass or ascites GU normal male MS no joint effusion EXt no LE or UE edema  No ulcers or wounds Neuro is alert, Ox 3, nf  EKG (independently reviewed) > afib VR 130, LBBB CXR (independently reviewed) > heart size larger than prior, no CHF. LA appendage more protruding than prior also. Prior sternotomy.   Home  medications > ecasa 81, bystolic 5/d, fenofibrate, pravastatin, kenalog cream  Creat 1.30, Na 140  K 3.9  Ca 10.2  Alb 4.7 Troponin 0.16 (0.00- 0.08) WBC 7k  Hb 16  plt 236  Assessment: 1 New onset afib w rapid vent rate - spoke w cardiology on call tonight, ok to heparinize in addition to rate control.   2 Hx HCOM/ CHF - no symptomatic CHF since open heart surgery/ correction done about 6 yrs ago. Takes bystolic and HL medications only.  3 HL on statin and fibrate  Plan - IV heparin and IV diltiazem for now. Cardiology to see tomorrow. ECHO tomorrow.    DVT Prophylaxis on IV hep Code Status: full  Family Communication: no family here  Disposition Plan: home when stable    Mayre Bury D [facility] Pager [phone]  Cell [phone]  If 7PM-7AM, please contact night-coverage [email or web address] Password [facility] 10/17/2015, 6:24 PM

## 2015-10-17 NOTE — ED Notes (Signed)
Istat troponin elevated- result to Dr Estell Harpin

## 2015-10-17 NOTE — ED Notes (Signed)
Patient with report of dizziness since Thursday. Seen at Dr Fletcher Anon office and sent for further eval. Patient with cardiac history and Dr Modesto Charon is cardiologist.

## 2015-10-17 NOTE — ED Notes (Signed)
Report to ICU Candelaria Stagers RN- pt informed of pending transfer

## 2015-10-18 ENCOUNTER — Inpatient Hospital Stay (HOSPITAL_COMMUNITY): Payer: BC Managed Care – PPO

## 2015-10-18 ENCOUNTER — Encounter: Payer: Self-pay | Admitting: Nurse Practitioner

## 2015-10-18 ENCOUNTER — Encounter (HOSPITAL_COMMUNITY): Payer: Self-pay | Admitting: Adult Health

## 2015-10-18 DIAGNOSIS — I4891 Unspecified atrial fibrillation: Secondary | ICD-10-CM

## 2015-10-18 DIAGNOSIS — Z8679 Personal history of other diseases of the circulatory system: Secondary | ICD-10-CM

## 2015-10-18 DIAGNOSIS — G4733 Obstructive sleep apnea (adult) (pediatric): Secondary | ICD-10-CM

## 2015-10-18 DIAGNOSIS — I5032 Chronic diastolic (congestive) heart failure: Secondary | ICD-10-CM

## 2015-10-18 LAB — CBC
HEMATOCRIT: 47.7 % (ref 39.0–52.0)
HEMOGLOBIN: 16.1 g/dL (ref 13.0–17.0)
MCH: 31.5 pg (ref 26.0–34.0)
MCHC: 33.8 g/dL (ref 30.0–36.0)
MCV: 93.3 fL (ref 78.0–100.0)
Platelets: 234 10*3/uL (ref 150–400)
RBC: 5.11 MIL/uL (ref 4.22–5.81)
RDW: 12.1 % (ref 11.5–15.5)
WBC: 8.9 10*3/uL (ref 4.0–10.5)

## 2015-10-18 LAB — TROPONIN I: TROPONIN I: 0.06 ng/mL — AB (ref <0.031)

## 2015-10-18 LAB — TSH: TSH: 2.741 u[IU]/mL (ref 0.350–4.500)

## 2015-10-18 LAB — HEPARIN LEVEL (UNFRACTIONATED): HEPARIN UNFRACTIONATED: 0.47 [IU]/mL (ref 0.30–0.70)

## 2015-10-18 MED ORDER — NEBIVOLOL HCL 2.5 MG PO TABS
5.0000 mg | ORAL_TABLET | Freq: Two times a day (BID) | ORAL | Status: DC
  Administered 2015-10-19: 5 mg via ORAL
  Filled 2015-10-18: qty 1
  Filled 2015-10-18 (×4): qty 2

## 2015-10-18 MED ORDER — NEBIVOLOL HCL 2.5 MG PO TABS
5.0000 mg | ORAL_TABLET | Freq: Once | ORAL | Status: AC
  Administered 2015-10-18: 5 mg via ORAL
  Filled 2015-10-18: qty 1

## 2015-10-18 MED ORDER — ZOLPIDEM TARTRATE 5 MG PO TABS
10.0000 mg | ORAL_TABLET | Freq: Every evening | ORAL | Status: DC | PRN
  Administered 2015-10-18 – 2015-10-20 (×3): 10 mg via ORAL
  Filled 2015-10-18 (×3): qty 2

## 2015-10-18 MED ORDER — RIVAROXABAN 20 MG PO TABS
20.0000 mg | ORAL_TABLET | Freq: Every day | ORAL | Status: DC
  Administered 2015-10-18 – 2015-10-20 (×3): 20 mg via ORAL
  Filled 2015-10-18 (×3): qty 1

## 2015-10-18 NOTE — Progress Notes (Signed)
Subjective:  Presents for complaints of lightheadedness and feeling bad for the past 2-3 days. On 2/10 had a brief near syncopal episode while showering. While getting dressed had a brief syncopal episode or he fell. Called his mother who came over and checked him on a monitor, he states there was no arrhythmia, pulse 50 BP 100/60. Wanted to go to the hospital to be evaluated but did not want to "scare his mother". Has noticed slight increase in shortness of breath with activity. No chest pain or tightness. Had some trouble breathing last night initially, was finally able to put on his CPAP and go to bed. No edema. Has not been sleeping well. No appetite. Has only 3 meals in the past 4 days. Has a complex cardiac history, his next appointment at [facility] is on 227. No fever cough sore throat ear pain or wheezing. No vomiting diarrhea or abdominal pain. Feels slightly bloated in his abdominal area. Taking fluids well. Voiding normal limit.  Objective:   BP 112/74 mmHg  Temp(Src) 98.6 F (37 C) (Oral)  Ht  (1.88 m)  Wt 254 lb 2 oz (115.27 kg)  BMI 32.61 kg/m2 NAD. Alert, oriented. TMs mild clear effusion, no erythema. Pharynx clear. Neck supple with minimal anterior adenopathy. Lungs clear. Heart very irregular with pulse fading in and out at the apical area. Abdomen mildly distended soft nontender. Patient has most recent EKG done at [facility] on his phone, shows sinus rhythm as the basic rhythm. EKG in office today shows atrial flutter with very irregular rhythm.  Assessment:  Irregular heart rate  Atrial flutter, unspecified type [facility])  Plan: Consulted with Dr. Brett Canales. Patient sent directly from office to local ED for evaluation.

## 2015-10-18 NOTE — Progress Notes (Signed)
[facility] PROGRESS NOTE  Casey Holland ZOX:[id number] DOB: Sep 07, 1970 DOA: 10/17/2015 PCP: Casey South, MD  Summary  45 yom with history of hypertrophic cardiomyopathy s/p myomectomy surgery 2010 presented with dizziness and palpation. He was found to be in new onset atrial fibrillation with RVR and started on Cardizem infusion. Heparin infusion recommended for anticoagulation. Anticipate discharge in 1-2 days.   Assessment/Plan: 1. New on set atrial fibrillation with RVR. Started on Cardizem infusion on admission and continued on home dose of bystolic. Admitting hospitalist consulted with cardiology over the phone, and it was recommended to use heparin for anticoagulation. ECHO and cardiology consult has been ordered. Heart rate is improving. Continue bystolic and continue to try to wean Cardizem infusion. Patient has a history of A fib in the past after his surgery 7 years ago, but did not have any recurrence until now. 2. Chronic diastolic heart failure. Appears compensated.  3. History of hypertrophic cardiomyopathy s/p myectomy surgery 2010.  4. Obstructive sleep apnea.  5. HLD. Continue statin.  Code Status: Full DVT prophylaxis: Heparin Family Communication: Family bedside Disposition Plan: Anticipate discharge within 1-2 days.   Consultants:  None  Procedures:  None  Antibiotics:  None  HPI/Subjective: Feeling tired. Denies chest pain, trouble breathing or edema.He reports feeling under the weather since 2/08 and took cold/congestion medications 2/09. He reports he was feeling lightheaded, dizzy and mentally foggy.Denied having fever. He also reports no afib in the last 7 years.  Objective: Filed Vitals:   10/18/15 0400 10/18/15 0500  BP: 101/76 94/82  Pulse: 64 57  Temp: 97.8 F (36.6 C)   Resp: 16 13    Intake/Output Summary (Last 24 hours) at 10/18/15 0725 Last data filed at 10/18/15 0500  Gross per 24 hour  Intake  54.42 ml  Output    800 ml   Net -745.58 ml   Filed Weights   10/17/15 1901 10/18/15 0500  Weight: 113.3 kg (249 lb 12.5 oz) 113.4 kg (250 lb)    Exam: General: NAD, Appears calm and looks comfortable.  Cardiovascular: Irregular irregularly  Respiratory: clear bilaterally, No wheezing, rales or rhonchi Abdomen: soft, non tender, no distention , bowel sounds normal Musculoskeletal: No edema b/l  Data Reviewed: Basic Metabolic Panel:  Recent Labs Lab 10/17/15 1556  NA 140  K 3.9  CL 102  CO2 29  GLUCOSE 123*  BUN 24*  CREATININE 1.30*  CALCIUM 10.2  MG 2.0   Liver Function Tests:  Recent Labs Lab 10/17/15 1556  AST 34  ALT 29  ALKPHOS 33*  BILITOT 1.0  PROT 7.6  ALBUMIN 4.7   CBC:  Recent Labs Lab 10/17/15 1556 10/18/15 0312  WBC 7.7 8.9  NEUTROABS 3.9  --   HGB 16.1 16.1  HCT 48.9 47.7  MCV 94.0 93.3  PLT 236 234    Recent Results (from the past 240 hour(s))  MRSA PCR Screening     Status: None   Collection Time: 10/17/15  6:40 PM  Result Value Ref Range Status   MRSA by PCR NEGATIVE NEGATIVE Final    Comment:        The GeneXpert MRSA Assay (FDA approved for NASAL specimens only), is one component of a comprehensive MRSA colonization surveillance program. It is not intended to diagnose MRSA infection nor to guide or monitor treatment for MRSA infections.      Studies: Dg Chest Portable 1 View  10/17/2015  CLINICAL DATA:  45 year old male with dizziness and shortness of breath  for the past 3 days. Past medical history includes cardiomyopathy, heart failure and heart surgery in 2010. EXAM: PORTABLE CHEST 1 VIEW COMPARISON:  Prior chest x-ray 12/06/2006 FINDINGS: Progressive cardiomegaly with enlargement of the left atrial appendage. Patient is status post median sternotomy. There is a fracture of the third sternal wire from the top. The fracture does not appear displaced. The remaining sternal wires are intact. Mild vascular congestion without overt edema. No  pneumothorax or pleural effusion. No acute osseous abnormality. IMPRESSION: 1. Progressive cardiomegaly compared to 12/06/2006. 2. Probable enlargement of the left atrial appendage. Does the patient have a clinical history of mitral valve pathology? 3. Surgical changes of prior median sternotomy. 4. Mild vascular congestion without overt edema. Electronically Signed   By: Casey Holland M.D.   On: 10/17/2015 16:41    Scheduled Meds: . fenofibrate  160 mg Oral Daily  . nebivolol  5 mg Oral Daily  . pravastatin  40 mg Oral q1800  . sodium chloride flush  3 mL Intravenous Q12H   Continuous Infusions: . diltiazem (CARDIZEM) infusion 5 mg/hr (10/17/15 1915)  . heparin 1,600 Units/hr (10/17/15 1945)    Principal Problem:   Atrial fibrillation with rapid ventricular response (HCC) Active Problems:   H/O hypertrophic cardiomyopathy - s/p myectomy surgery 2010    Time spent: 25 minutes     Casey Blinks, MD  [facility] Pager [phone]. If 7PM-7AM, please contact night-coverage at [email or web address], password [facility] 10/18/2015, 7:25 AM  LOS: 1 day     By signing my name below, I, Casey Holland, attest that this documentation has been prepared under the direction and in the presence of Casey Blinks, MD. Electronically signed: Zadie Holland, Scribe. 10/18/2015 9:40am.  I, Dr. Erick Holland, personally performed the services described in this documentaiton. All medical record entries made by the scribe were at my direction and in my presence. I have reviewed the chart and agree that the record reflects my personal performance and is accurate and complete  Casey Blinks, MD, 10/18/2015 9:56 AM

## 2015-10-18 NOTE — Care Management Note (Signed)
Case Management Note  Patient Details  Name: Casey Holland MRN: [id number] Date of Birth: April 30, 1971  Subjective/Objective:                  Pt admitted with a-fib. Pt is from home, ind with ADL's. Pt requires no assistance with mobility. Pt uses CPAP prior to admission. Pt has no HH services prior to admission. Pt started on xeralto. Pt uses [employer] .   Action/Plan: Benefits check in progress for Xeralto. Pt given $0 co-pay voucher for [employer]. Will f/u with benefits check results and further DC planning.   Expected Discharge Date:  10/19/15               Expected Discharge Plan:  Home/Self Care  In-House Referral:  NA  Discharge planning Services  CM Consult  Post Acute Care Choice:  NA Choice offered to:  NA  DME Arranged:    DME Agency:     HH Arranged:    HH Agency:     Status of Service:  Completed, signed off  Medicare Important Message Given:    Date Medicare IM Given:    Medicare IM give by:    Date Additional Medicare IM Given:    Additional Medicare Important Message give by:     If discussed at Long Length of Stay Meetings, dates discussed:    Additional Comments:  Malcolm Metro, RN 10/18/2015, 4:08 PM

## 2015-10-18 NOTE — Progress Notes (Addendum)
ANTICOAGULATION CONSULT NOTE  Pharmacy Consult for Heparin Indication: atrial fibrillation  Allergies  Allergen Reactions  . Vicodin [Hydrocodone-Acetaminophen] Nausea And Vomiting  . Latex Rash   Patient Measurements: Height:  (185.4 cm) Weight: 250 lb (113.4 kg) IBW/kg (Calculated) : 79.9 HEPARIN DW (KG): 103.9   Vital Signs: Temp: 97.8 F (36.6 C) (02/14 0400) Temp Source: Oral (02/14 0400) BP: 94/82 mmHg (02/14 0500) Pulse Rate: 57 (02/14 0500)  Labs:  Recent Labs  10/17/15 1556 10/18/15 0312  HGB 16.1 16.1  HCT 48.9 47.7  PLT 236 234  APTT 30  --   LABPROT 15.2  --   INR 1.18  --   HEPARINUNFRC  --  0.47  CREATININE 1.30*  --    Estimated Creatinine Clearance: 95.7 mL/min (by C-G formula based on Cr of 1.3).  Medical History: Past Medical History  Diagnosis Date  . Cardiomyopathy, hypertrophic nonobstructive (HCC)     subaortic valve gradient of with recent congestive heart failure and volume overload, now improved with diuretics  . 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 Asthma    Medications:  Prescriptions prior to admission  Medication Sig Dispense Refill Last Dose  . aspirin EC 81 MG tablet Take 81 mg by mouth daily.   10/17/2015 at Unknown time  . BYSTOLIC 5 MG tablet Take 5 mg by mouth daily.    10/17/2015 at Unknown time  . fenofibrate 160 MG tablet TAKE (1) TABLET BY MOUTH ONCE DAILY. 30 tablet 5 10/16/2015 at Unknown time  . pravastatin (PRAVACHOL) 40 MG tablet Take 1 tablet (40 mg total) by mouth daily. 30 tablet 5 10/16/2015 at Unknown time  . triamcinolone cream (KENALOG) 0.1 % Apply 1 application topically daily as needed.    unknown at Unknown time   Assessment:  Okay for Protocol, baseline anti coag labs WNL.  No bleeding noted.  Heparin level @ goal.  Goal of Therapy:  Heparin level 0.3-0.7 units/ml Monitor platelets by anticoagulation protocol: Yes   Plan:  Continue heparin infusion  at 1600 units/hr Check anti-Xa level daily while on heparin Continue to monitor H&H and platelets F/U long term AC plan.  Mady Gemma 10/18/2015,8:45 AM  Addum:  D/C heparin and start xarelto 20 mg po daily with supper.  F/u education. Talbert Cage, PharmD

## 2015-10-18 NOTE — Consult Note (Signed)
CARDIOLOGY CONSULT NOTE   Patient ID: Casey Holland MRN: [id number] DOB/AGE: 1971-02-21 45 y.o.  Admit Date: 10/17/2015 Referring Physician: Evlyn Clines MD Primary Physician: Lubertha South, MD Consulting Cardiologist: Dina Rich MD Primary Cardiologist: Dr. Esmond Harps Reason for Consultation: Atrial fib with RVR  Clinical Summary Casey Holland is a 45 y.o.male with known history of hypertrophic, non-obstructive cardiomyopathy, hx of myomectomy surgery at [facility] in 2015, diastolic CHF Class lll-lll, and asthma, who was seen in the ER after experiencing rapid HR and palpitations. He is normally followed by Dr. Regino Schultze at [facility] and sees him annually.   Four days ago, he began to feel dizzy and lightheaded and felt his heart rate going up. He states that he nearly fell into his bedside table when he got out of the shower.  He has been experiencing a sinus cold and congestion for about a week. Took,some Nyquil over the weekend because he was not sleeping. Continued to feel his heart racing. On Monday, he had appointment with his PCP, Sherie Don NP, who did an EKG which demonstrated rapid atrial fib. He was sent to ER for treatment.   On arrival to ER, HR 133, BP 138/95, O2 Sat 100%, afebrile. Troponin 0.16, Creatinine 1.30, CXR demonstrated progressive cardiomegaly, probable enlargement of the left atrial appendage, mild vascular congestion, without overt edema. He was given IV diltiazem bolus, and started on IV gtt along with heparin.   He states that he has been medically compliant, but has been having some depression and anxiety about work. He is a Engineer, site. His wife states that when he gets anxious, he can sometimes have rapid HR, but this usually brings on more anxiety and becomes a vicious cycle. He denies chest pain, edema or syncope, but has had profound dizziness and insomnia. He does have a history of OSA on CPAP.     Allergies  Allergen Reactions  . Vicodin  [Hydrocodone-Acetaminophen] Nausea And Vomiting  . Latex Rash    Medications Scheduled Medications: . fenofibrate  160 mg Oral Daily  . nebivolol  5 mg Oral Daily  . pravastatin  40 mg Oral q1800  . sodium chloride flush  3 mL Intravenous Q12H    Infusions: . diltiazem (CARDIZEM) infusion 5 mg/hr (10/17/15 1915)  . heparin 1,600 Units/hr (10/17/15 1945)    PRN Medications: sodium chloride, acetaminophen, ondansetron (ZOFRAN) IV, sodium chloride flush   Past Medical History  Diagnosis Date  . Cardiomyopathy, hypertrophic nonobstructive (HCC)     subaortic valve gradient of with recent congestive heart failure and volume overload, now improved with diuretics  . 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 Asthma     Past Surgical History  Procedure Laterality Date  . Cardiac surgery  2010    Family History  Problem Relation Age of Onset  . Cardiomyopathy Father     HOCM  . Cardiomyopathy Brother     HOCM  . Atrial fibrillation Brother     Deceased     Social History Casey Holland reports that he has never smoked. He does not have any smokeless tobacco history on file. Casey Holland reports that he does not drink alcohol.  Review of Systems Complete review of systems are found to be negative unless outlined in H&P above.  Physical Examination Blood pressure 94/82, pulse 57, temperature 97.8 F (36.6 C), temperature source Oral, resp. rate 13, height  (1.854 m), weight  250 lb (113.4 kg), SpO2 100 %.  Intake/Output Summary (Last 24 hours) at 10/18/15 1043 Last data filed at 10/18/15 0500  Gross per 24 hour  Intake  54.42 ml  Output    800 ml  Net -745.58 ml    Telemetry: Atrial fib with LVH rates in the 60'-70's.   ZOX:WRUEAVW and tired  HEENT: Conjunctiva and lids normal, oropharynx clear with moist mucosa. Neck: Supple, no elevated JVP or carotid bruits, no thyromegaly. Lungs: Clear to auscultation,  nonlabored breathing at rest. Cardiac: Regular rate and rhythm, no S3 or significant systolic murmur, no pericardial rub. Abdomen: Soft, nontender, no hepatomegaly, bowel sounds present, no guarding or rebound. Extremities: No pitting edema, distal pulses 2+. Skin: Warm and dry. Musculoskeletal: No kyphosis. Neuropsychiatric: Alert and oriented x3, affect grossly appropriate.  Prior Cardiac Testing/Procedures 1.Echocardigram ([facility]) 01/2015  AORTIC ROOT Size: Normal Dissection: INDETERM FOR DISSECTION  AORTIC VALVE Leaflets: Tricuspid Morphology: Normal Mobility: Fully Mobile  LEFT VENTRICLE Anterior: Normal Size: Normal Lateral: Normal Contraction: Normal Septal: Normal Closest EF: >55% (Estimated) Apical: Normal LV masses: No Masses Inferior: Normal LVH: MODERATE LVH CONCENTRIC Posterior: Normal LV Note: GLOBAL LONGITUDINAL STRAIN =-15.3% Dias.FxClass: N/A  MITRAL VALVE Leaflets: Normal Mobility: Fully mobile Morphology: Normal  LEFT ATRIUM Size: SEVERELY ENLARGED LA masses: No masses Normal IAS  MAIN PA Size: Normal  PULMONIC VALVE Morphology: Normal Mobility: Fully Mobile  RIGHT VENTRICLE Size: Normal Free wall: Normal Contraction: Normal RV masses: No Masses TAPSE: 2.1 cm, Normal Range [>= 1.6 cm] RV Note: TRICUSPID ANNULAR VELOCITY =10cm/sec.  TRICUSPID VALVE Leaflets: Normal Mobility: Fully mobile Morphology: Normal  RIGHT ATRIUM Size: MODERATELY ENLARGED RA Other: None RA masses: No masses RA Note: RA AREA =28.6cm^2.  PERICARDIUM Fluid: No effusion  INFERIOR VENACAVA Size: DILATED Normal respiratory collapse  DOPPLER ECHO and OTHER SPECIAL PROCEDURES ------------------------------------ Aortic: No AR No AS  Mitral: TRIVIAL MR No MS MV Inflow E Vel.= 67.6 cm/s MV Annulus E'Vel.= 5.6 cm/s E/E'Ratio= 12  Tricuspid: MILD TR No TS 3.2 m/s peak TR vel 49 mmHg peak RV pressure  Pulmonary: MILD PR No PS INORMAL LEFT VENTRICULAR SYSTOLIC  FUNCTION WITH MODERATE LVH NORMAL LA PRESSURES WITH DIASTOLIC DYSFUNCTION NORMAL RIGHT VENTRICULAR SYSTOLIC FUNCTION VALVULAR REGURGITATION: TRIVIAL MR, MILD PR, MILD TR NO VALVULAR STENOSIS S/P SEPTAL MYECTOMY PEAK LVOT GRADIENT OF WITH VALSALVA. SEPTAL PERFORATOR FLOW SEEN INTO LV; DIASTOLIC CORONARY FLOW VELOCITY PEAK (4 m/sec), C/W LV DIASTOLIC PRESSURE  USING DBP AT STUDY START Compared with prior Echo study on 10/15/2013: NO SIGNIFICANT CHANGE  Stress Test 11/01/2014 Spirometry: Spirometry is normal. The MVV is normal.  Exercise:  Exercise testing demonstrates moderate functional impairment with a peak oxygen consumption (VO2) of 15.7 ml/kg/min (51% of predicted). The peak RER of 0.92 indicates a submaximal effort was given by the patient. The exercise was limited by dizziness. Test was terminated by staff due to heart rate decline. The VE-VCO2 slope is normal. The ventilatory threshold is normal at 45% of the predicted peak VO2. At peak exercise, ventilation was well below the measured MVV indicating ventilatory reserve remained. Peak heart rate was 112 bpm (63% of age-predicted maximum) on beta blockade. Peak O2 pulse was 15.7 mL/beat (80% predicted).  Conclusion: 1) This was a sub-maximal exercise test, so maximal cardiorespiratory capacity could not be determined; however, based on the peak oxygen consumption (VO2) achieved, there was no worse than a moderate functional impairment, with peak VO2 15.7 ml/kg/min (51% of predicted normals). 2) There were sub-maximal signs  of a circulatory limitation to exercise primarily related to heart rate response to exercise. Patient had sinus rhythm with 1st degree AV block (PR  260 ms) and LBBB on baseline ECG. Exercise ECG notable for blunted heart rate augmentation with exercise, peaking at 112 bpm before heart rate declined with further exercise and test was stopped. 3) No apparent pulmonary limitation to  exercise.    Lab Results  Basic Metabolic Panel:  Recent Labs Lab 10/17/15 1556  NA 140  K 3.9  CL 102  CO2 29  GLUCOSE 123*  BUN 24*  CREATININE 1.30*  CALCIUM 10.2  MG 2.0    Liver Function Tests:  Recent Labs Lab 10/17/15 1556  AST 34  ALT 29  ALKPHOS 33*  BILITOT 1.0  PROT 7.6  ALBUMIN 4.7    CBC:  Recent Labs Lab 10/17/15 1556 10/18/15 0312  WBC 7.7 8.9  NEUTROABS 3.9  --   HGB 16.1 16.1  HCT 48.9 47.7  MCV 94.0 93.3  PLT 236 234    Radiology: Dg Chest Portable 1 View  10/17/2015  CLINICAL DATA:  45 year old male with dizziness and shortness of breath for the past 3 days. Past medical history includes cardiomyopathy, heart failure and heart surgery in 2010. EXAM: PORTABLE CHEST 1 VIEW COMPARISON:  Prior chest x-ray 12/06/2006 FINDINGS: Progressive cardiomegaly with enlargement of the left atrial appendage. Patient is status post median sternotomy. There is a fracture of the third sternal wire from the top. The fracture does not appear displaced. The remaining sternal wires are intact. Mild vascular congestion without overt edema. No pneumothorax or pleural effusion. No acute osseous abnormality. IMPRESSION: 1. Progressive cardiomegaly compared to 12/06/2006. 2. Probable enlargement of the left atrial appendage. Does the patient have a clinical history of mitral valve pathology? 3. Surgical changes of prior median sternotomy. 4. Mild vascular congestion without overt edema. Electronically Signed   By: Malachy Moan M.D.   On: 10/17/2015 16:41     ECG: Atrial fib with LBBB, with LVH,  rate of 126 bpm.    Impression and Recommendations  1. Atrial fib with RVR: CHADS VASC Score of 2. Continues in atrial fib with more controlled HR on diltiazem gtt at 5 mg hour and heparin. He is feeling some better but continues some anxiety. He denies chest pain. Only slight increase in troponin likely from demand ischemia. There is not evidence of anemia or  infection. He does have OSA, which may also be contributing. Will continue diltiazem gtt until repeat echo is completed to evaluate LV fx for changes. May be able to transition to po or increase home does of metoprolol. He is currently on Bystolic 5 mg at home.   Last echo in 2016 demonstrated normal LV fx but severely enlarged left atrium and moderate LVH. Creatinine is 1.30. Would recommend NOAC and possible DCCV. With significant LA enlargement he may not respond to this and will need to be rate controlled.   2. HOCM: Appears congenital with strong hx of this in brother and father. Heart rate control is essential and is improved on diltiazem gtt. Will transition to po medications after discussion with Dr. Wyline Mood, possibly increase BB..   3. OSA: He has CPAP which is uses at home.      Signed: Bettey Mare. Lawrence NP AACC  10/18/2015, 10:43 AM Co-Sign MD Attending Note  Patient seen and discussed with NP Lyman Bishop, I agree with her documentation above. 45 yo male with history of familial HOCM with prior  myomectomy at [facility] in 2015 now followed at [facility], chronic diastolic HF, and asthma admitted with afib with RVR. From his reported history he had afib s/p myomectomy but none since.    Echo 1202/103 LVEF 55-65%, grade I diastolic dysfunction, mild to mod LAE (index not reported) 01/2015 echo: normal LVEF, peak intracavitary gradient of 11 mmHg, severe LAE (no index reported) Mg 2, Cr 1.3, K 3.9, Hgb 16.1, Plt 236, trop 0.16 EKG afib LBBB  Patient with history of HOCM s/p myocmectomy, follow up imaging has not show recurrence of significant gradient. New onset afib, he is currently rate controlled on dilt gtt and continued on oral bystolic. CHADS2Vasc score of 0, however the patient's history of HOCM combined with his AF actually puts him at high risk for thromboembolic events and anticoagulation is typically recommended when both are present. We will start xarelto 20mg  daily and stop  heparin. Initial trial of rate control, if fails during admission or if after 3 weeks continued symptoms can consider DCCV. Will increase bystolic to 5mg  bid, wean dilt gtt. F/u echo  Dominga Ferry MD

## 2015-10-19 ENCOUNTER — Ambulatory Visit: Payer: BC Managed Care – PPO | Admitting: Family Medicine

## 2015-10-19 DIAGNOSIS — N179 Acute kidney failure, unspecified: Secondary | ICD-10-CM

## 2015-10-19 LAB — BASIC METABOLIC PANEL
Anion gap: 8 (ref 5–15)
BUN: 16 mg/dL (ref 6–20)
CALCIUM: 9.1 mg/dL (ref 8.9–10.3)
CO2: 28 mmol/L (ref 22–32)
CREATININE: 1.13 mg/dL (ref 0.61–1.24)
Chloride: 104 mmol/L (ref 101–111)
GFR calc Af Amer: >60 mL/min (ref >60)
GLUCOSE: 103 mg/dL — AB (ref 65–99)
Potassium: 4 mmol/L (ref 3.5–5.1)
Sodium: 140 mmol/L (ref 135–145)

## 2015-10-19 LAB — CBC
HCT: 47.4 % (ref 39.0–52.0)
Hemoglobin: 15.9 g/dL (ref 13.0–17.0)
MCH: 31.6 pg (ref 26.0–34.0)
MCHC: 33.5 g/dL (ref 30.0–36.0)
MCV: 94.2 fL (ref 78.0–100.0)
PLATELETS: 231 10*3/uL (ref 150–400)
RBC: 5.03 MIL/uL (ref 4.22–5.81)
RDW: 12.3 % (ref 11.5–15.5)
WBC: 7.9 10*3/uL (ref 4.0–10.5)

## 2015-10-19 MED ORDER — DILTIAZEM HCL 100 MG IV SOLR
5.0000 mg/h | INTRAVENOUS | Status: DC
  Administered 2015-10-19 (×2): 5 mg/h via INTRAVENOUS
  Filled 2015-10-19: qty 100

## 2015-10-19 MED ORDER — METOPROLOL TARTRATE 25 MG PO TABS
37.5000 mg | ORAL_TABLET | Freq: Four times a day (QID) | ORAL | Status: DC
  Administered 2015-10-19 – 2015-10-20 (×3): 37.5 mg via ORAL
  Filled 2015-10-19 (×4): qty 2

## 2015-10-19 MED ORDER — METOPROLOL TARTRATE 25 MG PO TABS
25.0000 mg | ORAL_TABLET | Freq: Four times a day (QID) | ORAL | Status: DC
  Administered 2015-10-19: 25 mg via ORAL
  Filled 2015-10-19: qty 1

## 2015-10-19 NOTE — Clinical Documentation Improvement (Signed)
Cardiology Internal Medicine  Can the diagnosis of Atrial Fibrillation be further specified? Please document your response in next progress note NOT in BPA drop down box. Thanks!   Paroxysmal Atrial fibrillation  Permanent Atrial fibrillation  Persistent Atrial fibrillation  Other  Clinically Undetermined  Document any associated diagnoses/conditions.  Supporting Information:  Had previous postop AF years ago following myomectomy  Xarelto initiated  Please exercise your independent, professional judgment when responding. A specific answer is not anticipated or expected.  Thank You,  Shellee Milo RN, BSN, CCDS Health Information Management  [phone]; Cell: [phone]

## 2015-10-19 NOTE — Progress Notes (Signed)
PROGRESS NOTE  Casey Holland:[id number] DOB: Feb 13, 1971 DOA: 10/17/2015 PCP: Lubertha South, MD Dr Modesto Charon at [facility]  Summary: 25 yom PMH  hypertrophic cardiomyopathy s/p myomectomy surgery 2010, presented with dizziness and palpitations. Found in ED to have new a-fib with RVR.    Assessment/Plan: 1. New onset atrial fibrillation with RVR. CHADS VASC Score of 0 but cardiology recommends anticoagulation with Xarelto based on h/o HOCM and atrial fibrillation. Remains tachycardic. 2. Demand ischemia secondary to afib. Troponin trending down. No further workup planned. 3. AKI, secondary to rapid HR; resolved.  4. History of hypertrophic cardiomyopathy s/p myectomy surgery 2010.  5. Obstructive sleep apnea.  6. HLD. Continue statin. 7. Impaired fasting glucose, A1c 5.2. 8. OSA. CPAP QJS   Continue rate control as per cardiology with change to metoprolol.  Home when improved.  Code Status: Full DVT prophylaxis: Heparin Family Communication: Mother at bedside Disposition Plan:    Brendia Sacks, MD  [facility]  Pager [phone] If 7PM-7AM, please contact night-coverage at [email or web address], password [facility] 10/19/2015, 6:20 AM  LOS: 2 days   Consultants:  Cardiology  Procedures:  ECHO Study Conclusions  - Left ventricle: The cavity size was normal. Wall thickness was increased in a pattern of moderate LVH. Systolic function was normal. The estimated ejection fraction was in the range of 60% to 65%. There is no evidence of [name]. There is no dynamic intracavitary gradient at rest, no valsalva gradients were performed. Wall motion was normal; there were no regional wall motion abnormalities. The study was not technically sufficient to allow evaluation of LV diastolic dysfunction due to atrial fibrillation. - Aortic valve: Valve area (VTI): 2.41 cm^2. Valve area (Vmax): 2.58 cm^2. - Left atrium: The atrium was severely dilated. Volume/bsa,  ES (1-plane Simpson&'s, A4C): 62.1 ml/m^2. - Right atrium: The atrium was moderately dilated. - Atrial septum: No defect or patent foramen ovale was identified. - Pulmonary arteries: Systolic pressure was mildly increased. PA peak pressure: 37 mm Hg (S). - Technically adequate study.  Antibiotics:  none  HPI/Subjective: Feeling better today and was able to sleep. Denis CP NV or trouble breathing.   Objective: Filed Vitals:   10/19/15 0200 10/19/15 0300 10/19/15 0400 10/19/15 0500  BP: 117/84 97/59 91/66  86/58  Pulse: 92 70 33 68  Temp:   98.6 F (37 C)   TempSrc:      Resp: 32  Height:      Weight:    113.2 kg (249 lb 9 oz)  SpO2: 99% 99% 97% 96%    Intake/Output Summary (Last 24 hours) at 10/19/15 8295 Last data filed at 10/19/15 0500  Gross per 24 hour  Intake    190 ml  Output    650 ml  Net   -460 ml     Filed Weights   10/17/15 1901 10/18/15 0500 10/19/15 0500  Weight: 113.3 kg (249 lb 12.5 oz) 113.4 kg (250 lb) 113.2 kg (249 lb 9 oz)    Exam:    Afebrile, non hypoxic General:  Appears calm and comfortable Eyes: PERRL, normal lids, irises & conjunctiva ENT: grossly normal hearing, lips & tongue Neck: no LAD, masses or thyromegaly Cardiovascular:  No LE edema. Irregular and tahycardic Telemetry: SR, no arrhythmias  Respiratory: CTA bilaterally, no w/r/r. Normal respiratory effort. Abdomen: soft, ntnd Skin: no rash or induration seen on limited exam Musculoskeletal: grossly normal tone BUE/BLE Psychiatric: grossly normal mood and affect, speech fluent and appropriate Neurologic: grossly non-focal.   New  data reviewed:  BMP/CBC unremarkable  Pertinent data since admission:  ECHO, as above  Pending data:  none  Scheduled Meds: . fenofibrate  160 mg Oral Daily  . nebivolol  5 mg Oral BID  . pravastatin  40 mg Oral q1800  . rivaroxaban  20 mg Oral Q supper  . sodium chloride flush  3 mL Intravenous Q12H   Continuous  Infusions: . diltiazem (CARDIZEM) infusion Stopped (10/18/15 1817)    Principal Problem:   Atrial fibrillation with rapid ventricular response (HCC) Active Problems:   Obstructive sleep apnea   H/O hypertrophic cardiomyopathy - s/p myectomy surgery 2010   AKI (acute kidney injury) (HCC)   Time spent 20 minutes   By signing my name below, I, Adron Bene, attest that this documentation has been prepared under the direction and in the presence of Moss Berry P. Irene Limbo, MD. Electronically Signed: Adron Bene, Scribe. 10/19/2015 9:43am  I personally performed the services described in this documentation. All medical record entries made by the scribe were at my direction. I have reviewed the chart and agree that the record reflects my personal performance and is accurate and complete. Brendia Sacks, MD

## 2015-10-19 NOTE — Progress Notes (Addendum)
Subjective:  Feeling better today.  Objective:  Vital Signs in the last 24 hours: Temp:  [97.4 F (36.3 C)-98.6 F (37 C)] 97.4 F (36.3 C) (02/15 0757) Pulse Rate:  [30-106] 68 (02/15 0500) Resp:  [12-32] 32 (02/15 0500) BP: (86-117)/(58-88) 86/58 mmHg (02/15 0500) SpO2:  [95 %-100 %] 96 % (02/15 0500) Weight:  [249 lb 9 oz (113.2 kg)] 249 lb 9 oz (113.2 kg) (02/15 0500)  Intake/Output from previous day: 02/14 0701 - 02/15 0700 In: 190 [I.V.:190] Out: 650 [Urine:650] Intake/Output from this shift:    Physical Exam: NECK: Without JVD, HJR, or bruit LUNGS: Clear anterior, posterior, lateral HEART: Irregular rate and rhythm, 2/6 sys murmur LSB no gallop, rub, bruit, thrill, or heave EXTREMITIES: mild ankle edema, brawny changes.   Lab Results:  Recent Labs  10/18/15 0312 10/19/15 0421  WBC 8.9 7.9  HGB 16.1 15.9  PLT 234 231    Recent Labs  10/17/15 1556 10/19/15 0421  NA 140 140  K 3.9 4.0  CL 102 104  CO2 29 28  GLUCOSE 123* 103*  BUN 24* 16  CREATININE 1.30* 1.13    Recent Labs  10/18/15 1301  TROPONINI 0.06*   Hepatic Function Panel  Recent Labs  10/17/15 1556  PROT 7.6  ALBUMIN 4.7  AST 34  ALT 29  ALKPHOS 33*  BILITOT 1.0   No results for input(s): CHOL in the last 72 hours. No results for input(s): PROTIME in the last 72 hours.    Cardiac Studies:  ECHO 10/18/15: Study Conclusions  - Left ventricle: The cavity size was normal. Wall thickness was   increased in a pattern of moderate LVH. Systolic function was   normal. The estimated ejection fraction was in the range of 60%   to 65%. There is no evidence of [name]. There is no dynamic   intracavitary gradient at rest, no valsalva gradients were   performed. Wall motion was normal; there were no regional wall   motion abnormalities. The study was not technically sufficient to   allow evaluation of LV diastolic dysfunction due to atrial   fibrillation. - Aortic valve: Valve area  (VTI): 2.41 cm^2. Valve area (Vmax):   2.58 cm^2. - Left atrium: The atrium was severely dilated. Volume/bsa, ES   (1-plane Simpson&'s, A4C): 62.1 ml/m^2. - Right atrium: The atrium was moderately dilated. - Atrial septum: No defect or patent foramen ovale was identified. - Pulmonary arteries: Systolic pressure was mildly increased. PA   peak pressure: 37 mm Hg (S). - Technically adequate study.  Echocardigram ([facility]) 01/2015   AORTIC ROOT Size: Normal Dissection: INDETERM FOR DISSECTION  AORTIC VALVE Leaflets: Tricuspid Morphology: Normal Mobility: Fully Mobile  LEFT VENTRICLE Anterior: Normal Size: Normal Lateral: Normal Contraction: Normal Septal: Normal Closest EF: >55% (Estimated) Apical: Normal LV masses: No Masses Inferior: Normal LVH: MODERATE LVH CONCENTRIC Posterior: Normal LV Note: GLOBAL LONGITUDINAL STRAIN =-15.3% Dias.FxClass: N/A  MITRAL VALVE Leaflets: Normal Mobility: Fully mobile Morphology: Normal  LEFT ATRIUM Size: SEVERELY ENLARGED LA masses: No masses Normal IAS  MAIN PA Size: Normal  PULMONIC VALVE Morphology: Normal Mobility: Fully Mobile  RIGHT VENTRICLE Size: Normal Free wall: Normal Contraction: Normal RV masses: No Masses TAPSE: 2.1 cm, Normal Range [>= 1.6 cm] RV Note: TRICUSPID ANNULAR VELOCITY =10cm/sec.  TRICUSPID VALVE Leaflets: Normal Mobility: Fully mobile Morphology: Normal  RIGHT ATRIUM Size: MODERATELY ENLARGED RA Other: None RA masses: No masses RA Note: RA AREA =28.6cm^2.  PERICARDIUM Fluid: No effusion  INFERIOR VENACAVA Size:  DILATED Normal respiratory collapse  DOPPLER ECHO and OTHER SPECIAL PROCEDURES ------------------------------------ Aortic: No AR No AS  Mitral: TRIVIAL MR No MS MV Inflow E Vel.= 67.6 cm/s MV Annulus E'Vel.= 5.6 cm/s E/E'Ratio= 12  Tricuspid: MILD TR No TS 3.2 m/s peak TR vel 49 mmHg peak RV pressure  Pulmonary: MILD PR No PS INORMAL LEFT VENTRICULAR SYSTOLIC FUNCTION WITH  MODERATE LVH NORMAL LA PRESSURES WITH DIASTOLIC DYSFUNCTION NORMAL RIGHT VENTRICULAR SYSTOLIC FUNCTION VALVULAR REGURGITATION: TRIVIAL MR, MILD PR, MILD TR NO VALVULAR STENOSIS S/P SEPTAL MYECTOMY PEAK LVOT GRADIENT OF WITH VALSALVA. SEPTAL PERFORATOR FLOW SEEN INTO LV; DIASTOLIC CORONARY FLOW VELOCITY PEAK (4 m/sec), C/W LV DIASTOLIC PRESSURE  USING DBP AT STUDY START Compared with prior Echo study on 10/15/2013: NO SIGNIFICANT CHANGE  Stress Test 11/01/2014 Spirometry: Spirometry is normal. The MVV is normal.  Exercise:  Exercise testing demonstrates moderate functional impairment with a peak oxygen consumption (VO2) of 15.7 ml/kg/min (51% of predicted). The peak RER of 0.92 indicates a submaximal effort was given by the patient. The exercise was limited by dizziness. Test was terminated by staff due to heart rate decline. The VE-VCO2 slope is normal. The ventilatory threshold is normal at 45% of the predicted peak VO2. At peak exercise, ventilation was well below the measured MVV indicating ventilatory reserve remained. Peak heart rate was 112 bpm (63% of age-predicted maximum) on beta blockade. Peak O2 pulse was 15.7 mL/beat (80% predicted).  Conclusion: 1) This was a sub-maximal exercise test, so maximal cardiorespiratory capacity could not be determined; however, based on the peak oxygen consumption (VO2) achieved, there was no worse than a moderate functional impairment, with peak VO2 15.7 ml/kg/min (51% of predicted normals). 2) There were sub-maximal signs of a circulatory limitation to exercise primarily related to heart rate response to exercise. Patient had sinus rhythm with 1st degree AV block (PR  260 ms) and LBBB on baseline ECG. Exercise ECG notable for blunted heart rate augmentation with exercise, peaking at 112 bpm before heart rate declined with further exercise and test was stopped. 3) No apparent pulmonary limitation to  exercise.    Assessment/Plan:  Atrial fib with RVR: CHADS VASC Score of 0 but higher risk with HOCM and AF. Xarelto started. Echo showed no gradient, normal LV function and severely dilated LA. Bystolic increased to 5 mg BID and off dilt. HR still 99-125/m.   2. HOCM: prior myomectomy at [facility], followed at [facility].   3. OSA: He has CPAP which is uses at home.        LOS: 2 days    Jacolyn Reedy 10/19/2015, 8:36 AM   Attending Note Patient seen and discussed with PA Geni Bers, I agree with her documentation above. New onset afib in patient with history of HOCM s/p myomectomy. Repeat echo yesterday shows no significant dynamic gradient, though valvalava was not performed. From recent [facility] echo there was no significant gradient with valsalva. Severe LAE on echo with LA index of 60 noted in the setting of new onset afib. He is currently rate controlled with bisoprolol, started on xarelto for stroke prevention in setting of afib with coexisting HOCM. He is off dilt drip but rates remain elevated on bisoprolol 5mg  bid. We will change to lopressor to give [place] more flexibility with titration, start 25mg  po q 6 hrs with hold parameters. He already received his AM bisoprolol, will start lopressor dosing at 12 pm. With severe LAE chances of maintaining NSR is significantly decreased, follow how he does  with rate control strategy.   Dominga Ferry MD

## 2015-10-19 NOTE — Discharge Instructions (Signed)

## 2015-10-19 NOTE — Progress Notes (Signed)
Elevated heart rates in early afternoon, will resume dilt gtt. Started on oral metoprolol, hopefully will be able to wean dilt drip as beta blocker takes effect. He has underlying LBBB, ekg shows afib with LBBB and not VT.   Dominga Ferry MD

## 2015-10-20 LAB — BASIC METABOLIC PANEL
ANION GAP: 9 (ref 5–15)
BUN: 20 mg/dL (ref 6–20)
CALCIUM: 9 mg/dL (ref 8.9–10.3)
CO2: 22 mmol/L (ref 22–32)
Chloride: 104 mmol/L (ref 101–111)
Creatinine, Ser: 1.01 mg/dL (ref 0.61–1.24)
GLUCOSE: 104 mg/dL — AB (ref 65–99)
POTASSIUM: 4 mmol/L (ref 3.5–5.1)
Sodium: 135 mmol/L (ref 135–145)

## 2015-10-20 LAB — MAGNESIUM: Magnesium: 2.1 mg/dL (ref 1.7–2.4)

## 2015-10-20 MED ORDER — METOPROLOL SUCCINATE ER 50 MG PO TB24
75.0000 mg | ORAL_TABLET | Freq: Two times a day (BID) | ORAL | Status: DC
  Administered 2015-10-20: 75 mg via ORAL
  Filled 2015-10-20: qty 1

## 2015-10-20 NOTE — Progress Notes (Signed)
Rates this afternoon 60-80s, bp stable. We will stop lopressor and start Toprol XL  bid. Hopefully will continue to control rates overnight, if numbers look good plan for discharge tomorrow.   Dominga Ferry MD

## 2015-10-20 NOTE — Progress Notes (Addendum)
Primary Cardiologist: Regino Schultze [facility])   Cardiology Specific Problem List: 1. HOCM 2. Atrial fib with RVR  Subjective:    Tired and frustrated. Wants to know the plan of action.   Objective:   Temp:  [96.5 F (35.8 C)-97.6 F (36.4 C)] 97.6 F (36.4 C) (02/16 0849) Pulse Rate:  [28-137] 57 (02/16 0747) Resp:  [13-24] 15 (02/16 0600) BP: (84-122)/(47-101) 114/90 mmHg (02/16 0747) SpO2:  [94 %-98 %] 94 % (02/16 0600) Weight:  [249 lb 12.5 oz (113.3 kg)] 249 lb 12.5 oz (113.3 kg) (02/16 0500) Last BM Date: 10/17/15  Filed Weights   10/18/15 0500 10/19/15 0500 10/20/15 0500  Weight: 250 lb (113.4 kg) 249 lb 9 oz (113.2 kg) 249 lb 12.5 oz (113.3 kg)    Intake/Output Summary (Last 24 hours) at 10/20/15 0938 Last data filed at 10/20/15 0849  Gross per 24 hour  Intake 560.63 ml  Output    725 ml  Net -164.37 ml    Telemetry: Atrial fib with periods of bradycardia overnight.   Exam:  General: No acute distress.  HEENT: Conjunctiva and lids normal, oropharynx clear.  Lungs: Clear to auscultation, nonlabored.  Cardiac: No elevated JVP or bruits. IRRR, no gallop or rub.   Abdomen: Normoactive bowel sounds, nontender, nondistended.  Extremities: No pitting edema, distal pulses full.  Neuropsychiatric: Alert and oriented x3, affect appropriate.   Lab Results:  Basic Metabolic Panel:  Recent Labs Lab 10/17/15 1556 10/19/15 0421  NA 140 140  K 3.9 4.0  CL 102 104  CO2 29 28  GLUCOSE 123* 103*  BUN 24* 16  CREATININE 1.30* 1.13  CALCIUM 10.2 9.1  MG 2.0  --     Liver Function Tests:  Recent Labs Lab 10/17/15 1556  AST 34  ALT 29  ALKPHOS 33*  BILITOT 1.0  PROT 7.6  ALBUMIN 4.7    CBC:  Recent Labs Lab 10/17/15 1556 10/18/15 0312 10/19/15 0421  WBC 7.7 8.9 7.9  HGB 16.1 16.1 15.9  HCT 48.9 47.7 47.4  MCV 94.0 93.3 94.2  PLT 236 234 231    Cardiac Enzymes:  Recent Labs Lab 10/18/15 1301  TROPONINI 0.06*     Coagulation:  Recent Labs Lab 10/17/15 1556  INR 1.18    Medications:   Scheduled Medications: . fenofibrate  160 mg Oral Daily  . metoprolol tartrate  37.5 mg Oral Q6H  . pravastatin  40 mg Oral q1800  . rivaroxaban  20 mg Oral Q supper  . sodium chloride flush  3 mL Intravenous Q12H    Infusions: . diltiazem (CARDIZEM) infusion 5 mg/hr (10/20/15 0600)    PRN Medications: sodium chloride, acetaminophen, ondansetron (ZOFRAN) IV, sodium chloride flush, zolpidem   Assessment and Plan:   1. Atrial fib with RVR: Heart rate is up and down. He is having periods of bradycardia. Still on diltiazem gtt at 5 mg/hour. He also continues on anticoagulation. He has multiple questions about next steps. Discussed medication adjustment, DCCV but will defer to Dr. Wyline Mood for his approach. Uncertain if DCCV will be helpful with enlarged LA.   2. OSA: Continues use of CPAP. Sleeping better with Serafina Royals. Neeley Sedivy NP [facility]  10/20/2015, 9:38 AM   Patient seen and discussed with NP Lyman Bishop, I agree with her documentation. Rates elevated yesterday, had to restart dilt gtt. He had also been started on oral lopressor with dose increased to 37.5mg  q 6 hours. Currently on dilt at 5 mg/hr, AM dose  of lopressor held. Some bradycardia overnight, rates 70-80s this AM. We will d/c dilt drip today, see how he responds to oral lopressor at increased dose of 37.5mg  po q 6 hours. Will f/u rates later today.   Dominga Ferry MD

## 2015-10-20 NOTE — Progress Notes (Signed)
eLink Physician-Brief Progress Note Patient Name: Casey Holland DOB: 15-Feb-1971 MRN: [id number]   Date of Service  10/20/2015  HPI/Events of Note  Arrhythmia tonight, brady to tachy afib with varying QRS complex morphology dilt held this morning  eICU Interventions  Have paged cardiology STAT BMET, Mg, EKG     Intervention Category Major Interventions: Arrhythmia - evaluation and management  Max Fickle 10/20/2015, 8:51 PM

## 2015-10-20 NOTE — Progress Notes (Signed)
PROGRESS NOTE  Casey Holland:[id number] DOB: [birth year] DOA: 10/17/2015 PCP: Lubertha South, MD Dr Modesto Charon at [facility]  Summary: 57 yom PMH  hypertrophic cardiomyopathy s/p myomectomy surgery 2010, presented with dizziness and palpitations. Found in ED to have new a-fib with RVR.    Assessment/Plan: 1. New onset atrial fibrillation with RVR. CHADS VASC Score of 0 but cardiology recommended anticoagulation with Xarelto based on h/o HOCM and atrial fibrillation.   2. Demand ischemia secondary to afib. Troponin trending down. No further workup planned. 3. AKI, secondary to rapid HR; resolved.  4. History of hypertrophic cardiomyopathy s/p myectomy surgery 2010.  5. Obstructive sleep apnea.  6. HLD. Continue statin. 7. Impaired fasting glucose, A1c 5.2. 8. OSA. CPAP QJS   Overall improved. Continue rate control per cardiology.   Discussed with Dr. Wyline Mood, likely home later today if rate-controlled.  Code Status: Full DVT prophylaxis: Heparin Family Communication: Mother at bedside Disposition Plan:  home  Brendia Sacks, MD  [facility]  Pager [phone] If 7PM-7AM, please contact night-coverage at [email or web address], password [facility] 10/20/2015, 6:37 AM  LOS: 3 days   Consultants:  Cardiology  Procedures:  ECHO Study Conclusions  - Left ventricle: The cavity size was normal. Wall thickness was increased in a pattern of moderate LVH. Systolic function was normal. The estimated ejection fraction was in the range of 60% to 65%. There is no evidence of [name]. There is no dynamic intracavitary gradient at rest, no valsalva gradients were performed. Wall motion was normal; there were no regional wall motion abnormalities. The study was not technically sufficient to allow evaluation of LV diastolic dysfunction due to atrial fibrillation. - Aortic valve: Valve area (VTI): 2.41 cm^2. Valve area (Vmax): 2.58 cm^2. - Left atrium: The atrium was severely  dilated. Volume/bsa, ES (1-plane Simpson&'s, A4C): 62.1 ml/m^2. - Right atrium: The atrium was moderately dilated. - Atrial septum: No defect or patent foramen ovale was identified. - Pulmonary arteries: Systolic pressure was mildly increased. PA peak pressure: 37 mm Hg (S). - Technically adequate study.  Antibiotics:  none  HPI/Subjective Feels fine, no complaints.  Objective: Filed Vitals:   10/20/15 0000 10/20/15 0100 10/20/15 0200 10/20/15 0300  BP: 99/61  Pulse: 28 44 54 53  Temp:      TempSrc:      Resp: Height:      Weight:      SpO2: 95% 96% 98% 98%    Intake/Output Summary (Last 24 hours) at 10/20/15 0637 Last data filed at 10/20/15 0300  Gross per 24 hour  Intake 305.63 ml  Output    725 ml  Net -419.37 ml     Filed Weights   10/17/15 1901 10/18/15 0500 10/19/15 0500  Weight: 113.3 kg (249 lb 12.5 oz) 113.4 kg (250 lb) 113.2 kg (249 lb 9 oz)    Exam:    Afebrile, non hypoxic General:  Appears comfortable, calm. Cardiovascular: irregular, normal rate, no murmur, rub or gallop. No lower extremity edema. Telemetry: afib Respiratory: Clear to auscultation bilaterally, no wheezes, rales or rhonchi. Normal respiratory effort. Psychiatric: grossly normal mood and affect, speech fluent and appropriate   New data reviewed:  none  Pertinent data since admission:  ECHO, as above Scheduled Meds: . fenofibrate  160 mg Oral Daily  . metoprolol tartrate  37.5 mg Oral Q6H  . pravastatin  40 mg Oral q1800  . rivaroxaban  20 mg Oral Q supper  . sodium chloride flush  3 mL Intravenous Q12H   Continuous Infusions: . diltiazem (CARDIZEM) infusion 5 mg/hr (10/20/15 0300)    Principal Problem:   Atrial fibrillation with rapid ventricular response (HCC) Active Problems:   Obstructive sleep apnea   H/O hypertrophic cardiomyopathy - s/p myectomy surgery 2010   AKI (acute kidney injury) (HCC)   Time spent 20 minutes  By  signing my name below, I, Burnett Harry attest that this documentation has been prepared under the direction and in the presence of Brendia Sacks, MD Electronically signed: Burnett Harry, Scribe.  10/20/2015     I personally performed the services described in this documentation. All medical record entries made by the scribe were at my direction. I have reviewed the chart and agree that the record reflects my personal performance and is accurate and complete. Brendia Sacks, MD

## 2015-10-20 NOTE — Progress Notes (Signed)
eLink Physician-Brief Progress Note Patient Name: Casey Holland DOB: Jul 04, 1971 MRN: [id number]   Date of Service  10/20/2015  HPI/Events of Note  Chart reviewed in detail Complex cardiac history Baseline left bundle block, hypertrophic cardiomyopathy and long standing afib Had been managed on dilt drip and escalating doses of metoprolol. Today dilt held and extended release Metoprolol  XL administered. Nurse calling because of worrisome because of brady with intermittent tachycardia. Baseline EKG shows LBBB and afib on dilt drip from last two days.  eICU Interventions  Restart low dose dilt drip F/U BMET, Mg, 12 lead Discussed with cardiology     Intervention Category Major Interventions: Arrhythmia - evaluation and management  Casey Holland 10/20/2015, 9:28 PM

## 2015-10-20 NOTE — Progress Notes (Signed)
Morning metoprolol held d/t HR of 57.

## 2015-10-21 MED ORDER — METOPROLOL SUCCINATE ER 100 MG PO TB24: 100.0000 mg | ORAL_TABLET | Freq: Two times a day (BID) | ORAL | Status: DC

## 2015-10-21 MED ORDER — RIVAROXABAN 20 MG PO TABS: 20.0000 mg | ORAL_TABLET | Freq: Every day | ORAL | Status: DC

## 2015-10-21 MED ORDER — DILTIAZEM HCL 60 MG PO TABS: 60.0000 mg | ORAL_TABLET | Freq: Two times a day (BID) | ORAL | Status: DC

## 2015-10-21 MED ORDER — DILTIAZEM HCL 30 MG PO TABS
30.0000 mg | ORAL_TABLET | Freq: Two times a day (BID) | ORAL | Status: DC
  Administered 2015-10-21: 30 mg via ORAL
  Filled 2015-10-21: qty 1

## 2015-10-21 MED ORDER — DILTIAZEM HCL 30 MG PO TABS
30.0000 mg | ORAL_TABLET | Freq: Once | ORAL | Status: AC
  Administered 2015-10-21: 30 mg via ORAL
  Filled 2015-10-21: qty 1

## 2015-10-21 MED ORDER — METOPROLOL SUCCINATE ER 50 MG PO TB24
100.0000 mg | ORAL_TABLET | Freq: Two times a day (BID) | ORAL | Status: DC
  Administered 2015-10-21: 100 mg via ORAL
  Filled 2015-10-21: qty 2

## 2015-10-21 NOTE — Progress Notes (Signed)
PROGRESS NOTE  Casey Holland BJY:[id number] DOB: [birth year] DOA: 10/17/2015 PCP: Lubertha South, MD Dr Modesto Charon at [facility]  Summary: 80 yom PMH  hypertrophic cardiomyopathy s/p myomectomy surgery 2010, presented with dizziness and palpitations. Found in ED to have new a-fib with RVR.    Assessment/Plan: 1. New onset atrial fibrillation with RVR. Intermittent tachycardia. Rate control strategy per cardiology. CHADS VASC Score of 0 but cardiology recommended anticoagulation with Xarelto based on h/o HOCM and atrial fibrillation.   2. Demand ischemia secondary to afib. Troponin trended down. No further workup planned. 3. AKI, secondary to rapid HR; resolved.  4. History of hypertrophic cardiomyopathy s/p myectomy surgery 2010.  5. OSA. CPAP QHS   Asymptomatic at this point. Discussed with Dr. Wyline Mood. Adjustments per cardiology to rate control strategy.  May be able to go home later today.  Code Status: Full DVT prophylaxis: Heparin Family Communication: No family at bedside.  Disposition Plan:  home  Brendia Sacks, MD  [facility]  Pager [phone] If 7PM-7AM, please contact night-coverage at [email or web address], password [facility] 10/21/2015, 6:53 AM  LOS: 4 days   Consultants:  Cardiology  Pulmonology  Procedures:  ECHO Study Conclusions  - Left ventricle: The cavity size was normal. Wall thickness was increased in a pattern of moderate LVH. Systolic function was normal. The estimated ejection fraction was in the range of 60% to 65%. There is no evidence of [name]. There is no dynamic intracavitary gradient at rest, no valsalva gradients were performed. Wall motion was normal; there were no regional wall motion abnormalities. The study was not technically sufficient to allow evaluation of LV diastolic dysfunction due to atrial fibrillation. - Aortic valve: Valve area (VTI): 2.41 cm^2. Valve area (Vmax): 2.58 cm^2. - Left atrium: The atrium was severely  dilated. Volume/bsa, ES (1-plane Simpson&'s, A4C): 62.1 ml/m^2. - Right atrium: The atrium was moderately dilated. - Atrial septum: No defect or patent foramen ovale was identified. - Pulmonary arteries: Systolic pressure was mildly increased. PA peak pressure: 37 mm Hg (S). - Technically adequate study.  Antibiotics:  none  HPI/Subjective Feels well. Denies any CP, palpitations, SOB, nausea, or vomiting.   Objective: Filed Vitals:   10/21/15 0400 10/21/15 0500 10/21/15 0530 10/21/15 0545  BP:   95/67 108/76  Pulse: 42  45 104  Temp: 96.7 F (35.9 C)     TempSrc: Axillary     Resp: Height:      Weight:  112.7 kg (248 lb 7.3 oz)    SpO2: 96%  97% 96%    Intake/Output Summary (Last 24 hours) at 10/21/15 0653 Last data filed at 10/21/15 0520  Gross per 24 hour  Intake 336.68 ml  Output    550 ml  Net -213.32 ml     Filed Weights   10/19/15 0500 10/20/15 0500 10/21/15 0500  Weight: 113.2 kg (249 lb 9 oz) 113.3 kg (249 lb 12.5 oz) 112.7 kg (248 lb 7.3 oz)    Exam:   Afebrile, non hypoxic General:  Appears calm and comfortable Cardiovascular: irregular, no m/r/g. No LE edema. Telemetry: irregular wide complex Respiratory: CTA bilaterally, no w/r/r. Normal respiratory effort. Psychiatric: grossly normal mood and affect, speech fluent and appropriate  New data reviewed:  No new data  Scheduled Meds: . fenofibrate  160 mg Oral Daily  . metoprolol succinate  100 mg Oral BID  . pravastatin  40 mg Oral q1800  . rivaroxaban  20 mg Oral Q supper  . sodium  chloride flush  3 mL Intravenous Q12H   Continuous Infusions: . diltiazem (CARDIZEM) infusion Stopped (10/21/15 0520)    Principal Problem:   Atrial fibrillation with rapid ventricular response (HCC) Active Problems:   Obstructive sleep apnea   H/O hypertrophic cardiomyopathy - s/p myectomy surgery 2010   AKI (acute kidney injury) (HCC)     By signing my name below, I, Burnett Harry  attest that this documentation has been prepared under the direction and in the presence of Brendia Sacks, MD Electronically signed: Burnett Harry, Scribe.  10/21/2015 8:48am    I personally performed the services described in this documentation. All medical record entries made by the scribe were at my direction. I have reviewed the chart and agree that the record reflects my personal performance and is accurate and complete. Brendia Sacks, MD

## 2015-10-21 NOTE — Progress Notes (Signed)
     Subjective:    No complaints  Objective:   Temp:  [96.7 F (35.9 C)-97.6 F (36.4 C)] 96.7 F (35.9 C) (02/17 0700) Pulse Rate:  [32-104] 101 (02/17 0745) Resp:  [15-22] 15 (02/17 0700) BP: (86-126)/(58-98) 119/78 mmHg (02/17 0745) SpO2:  [93 %-100 %] 98 % (02/17 0700) Weight:  [248 lb 7.3 oz (112.7 kg)] 248 lb 7.3 oz (112.7 kg) (02/17 0500) Last BM Date: 10/17/15  Filed Weights   10/19/15 0500 10/20/15 0500 10/21/15 0500  Weight: 249 lb 9 oz (113.2 kg) 249 lb 12.5 oz (113.3 kg) 248 lb 7.3 oz (112.7 kg)    Intake/Output Summary (Last 24 hours) at 10/21/15 0834 Last data filed at 10/21/15 0520  Gross per 24 hour  Intake 336.68 ml  Output    550 ml  Net -213.32 ml    Telemetry: afib, LBBB  Exam:  General: NAD  HEENT: sclera clear, throat clear  Resp: CTAB  Cardiac: irreg, no m/r/g, no jvd  GI: abdomen soft, NT, ND  MSK: no LE edema  Neuro: no focal deficits  Psych: appropriate affect  Lab Results:  Basic Metabolic Panel:  Recent Labs Lab 10/17/15 1556 10/19/15 0421 10/20/15 2051  NA 140 140 135  K 3.9 4.0 4.0  CL 102 104 104  CO2 GLUCOSE 123* 103* 104*  BUN 24* 16 20  CREATININE 1.30* 1.13 1.01  CALCIUM 10.2 9.1 9.0  MG 2.0  --  2.1    Liver Function Tests:  Recent Labs Lab 10/17/15 1556  AST 34  ALT 29  ALKPHOS 33*  BILITOT 1.0  PROT 7.6  ALBUMIN 4.7    CBC:  Recent Labs Lab 10/17/15 1556 10/18/15 0312 10/19/15 0421  WBC 7.7 8.9 7.9  HGB 16.1 16.1 15.9  HCT 48.9 47.7 47.4  MCV 94.0 93.3 94.2  PLT 236 234 231    Cardiac Enzymes:  Recent Labs Lab 10/18/15 1301  TROPONINI 0.06*    BNP: No results for input(s): PROBNP in the last 8760 hours.  Coagulation:  Recent Labs Lab 10/17/15 1556  INR 1.18    ECG:   Medications:   Scheduled Medications: . fenofibrate  160 mg Oral Daily  . metoprolol succinate  100 mg Oral BID  . pravastatin  40 mg Oral q1800  . rivaroxaban  20 mg Oral Q supper   . sodium chloride flush  3 mL Intravenous Q12H     Infusions: . diltiazem (CARDIZEM) infusion Stopped (10/21/15 0520)     PRN Medications:  sodium chloride, acetaminophen, ondansetron (ZOFRAN) IV, sodium chloride flush, zolpidem     Assessment/Plan   1. Afib - new onset afib this admit - rate controlled initially with dilt gtt, have been working to transition to oral regimen. Rates elevated overnight, back on dilt gtt for a period of time, now off. Toprol XL increased to  bid starting with this AM dose, depending rates would consider low dose oral short acting cardizem - he is on xarelto for stroke prevention, low CHADS2Vasc score however in setting HOCM he is at increased risk of thromboembolic events with afib and anticoag is indicated.  - will start dilt  bid this AM, follow up rates in afternoon. If controlled would anticipate discharge.         Dina Rich, M.D.

## 2015-10-21 NOTE — Discharge Summary (Signed)
Physician Discharge Summary  Casey Holland JXB:[id number] DOB: [birth year] DOA: 10/17/2015  PCP: Lubertha South, MD  Admit date: 10/17/2015 Discharge date: 10/21/2015  Recommendations for Outpatient Follow-up:  1. Follow up with cardiology in the near future as below   Follow-up Information    Follow up with Joni Reining, NP On 11/07/2015.   Specialties:  Nurse Practitioner, Radiology, Cardiology   Why:  1:50pm   Contact information:   [address] [place] [address] [phone]       Discharge Diagnoses:  1. New onset atrial fibrillation with RVR. 2. Demand ischemia secondary to afib.  3. AKI 4. PMH hypertrophic cardiomyopathy s/p myectomy   5. OSA. CPAP QHS  Discharge Condition: Improved Disposition: Home  Diet recommendation: Heart healthy  Filed Weights   10/19/15 0500 10/20/15 0500 10/21/15 0500  Weight: 113.2 kg (249 lb 9 oz) 113.3 kg (249 lb 12.5 oz) 112.7 kg (248 lb 7.3 oz)    History of present illness:  45 yom PMH hypertrophic cardiomyopathy s/p myomectomy surgery, presented with dizziness and palpitations. Found in ED to have new a-fib with RVR.   Hospital Course:  Patient presented with dizziness and palpitations, found to be in new onset afib with RVR. He was started on a Cardizem drip with some improvement however continued to have intermittent episodes of tachycardia. Cardiology was consulted and recommended weaning Cardizem drip and starting on Toprol XL, despite this rate control somewhat difficult and he was started on oral Cardizem. This point he is felt to be suitable for discharge. He will be continued on this at discharge as well as Xarelto for anticoagulation despite CHADS score given his history of HOCM. On admission, patient's troponin was also noted to be elevated, likely demand ischemia in the setting of atrial fibrillation with RVR.    Individual issues as below:  1. New onset atrial fibrillation with RVR. Intermittent tachycardia. Rate  control strategy per cardiology. CHADS VASC Score of 0 but cardiology recommended anticoagulation with Xarelto based on h/o HOCM and atrial fibrillation.  2. Demand ischemia secondary to afib. Troponin trended down. No further workup planned. 3. AKI, secondary to rapid HR; resolved.  4. History of hypertrophic cardiomyopathy s/p myectomy surgery 2010.  5. OSA. CPAP QHS  Consultants:  Cardiology  Pulmonology  Procedures:  ECHO Study Conclusions  - Left ventricle: The cavity size was normal. Wall thickness was increased in a pattern of moderate LVH. Systolic function was normal. The estimated ejection fraction was in the range of 60% to 65%. There is no evidence of [name]. There is no dynamic intracavitary gradient at rest, no valsalva gradients were performed. Wall motion was normal; there were no regional wall motion abnormalities. The study was not technically sufficient to allow evaluation of LV diastolic dysfunction due to atrial fibrillation. - Aortic valve: Valve area (VTI): 2.41 cm^2. Valve area (Vmax): 2.58 cm^2. - Left atrium: The atrium was severely dilated. Volume/bsa, ES (1-plane Simpson&'s, A4C): 62.1 ml/m^2. - Right atrium: The atrium was moderately dilated. - Atrial septum: No defect or patent foramen ovale was identified. - Pulmonary arteries: Systolic pressure was mildly increased. PA peak pressure: 37 mm Hg (S). - Technically adequate study.  Antibiotics:  none  Discharge Instructions Discharge Instructions    Activity as tolerated - No restrictions    Complete by:  As directed      Diet general    Complete by:  As directed      Discharge instructions    Complete by:  As directed   Call your physician or seek immediate medical attention for chest pain, shortness of breath, heart racing, dizziness, passing out or worsening of condition.            Current Discharge Medication List    START taking these medications    Details  diltiazem (CARDIZEM) 60 MG tablet Take 1 tablet (60 mg total) by mouth 2 (two) times daily. Qty: 60 tablet, Refills: 0    metoprolol succinate (TOPROL-XL) 100 MG 24 hr tablet Take 1 tablet (100 mg total) by mouth 2 (two) times daily. Take with or immediately following a meal. Qty: 60 tablet, Refills: 0    rivaroxaban (XARELTO) 20 MG TABS tablet Take 1 tablet (20 mg total) by mouth daily with supper. Qty: 30 tablet, Refills: 0      CONTINUE these medications which have NOT CHANGED   Details  fenofibrate 160 MG tablet TAKE (1) TABLET BY MOUTH ONCE DAILY. Qty: 30 tablet, Refills: 5    pravastatin (PRAVACHOL) 40 MG tablet Take 1 tablet (40 mg total) by mouth daily. Qty: 30 tablet, Refills: 5    triamcinolone cream (KENALOG) 0.1 % Apply 1 application topically daily as needed.       STOP taking these medications     aspirin EC 81 MG tablet      BYSTOLIC 5 MG tablet        Allergies  Allergen Reactions  . Vicodin [Hydrocodone-Acetaminophen] Nausea And Vomiting  . Latex Rash    The results of significant diagnostics from this hospitalization (including imaging, microbiology, ancillary and laboratory) are listed below for reference.    Significant Diagnostic Studies: Dg Chest Portable 1 View  10/17/2015  CLINICAL DATA:  45 year old male with dizziness and shortness of breath for the past 3 days. Past medical history includes cardiomyopathy, heart failure and heart surgery in 2010. EXAM: PORTABLE CHEST 1 VIEW COMPARISON:  Prior chest x-ray 12/06/2006 FINDINGS: Progressive cardiomegaly with enlargement of the left atrial appendage. Patient is status post median sternotomy. There is a fracture of the third sternal wire from the top. The fracture does not appear displaced. The remaining sternal wires are intact. Mild vascular congestion without overt edema. No pneumothorax or pleural effusion. No acute osseous abnormality. IMPRESSION: 1. Progressive cardiomegaly compared to  12/06/2006. 2. Probable enlargement of the left atrial appendage. Does the patient have a clinical history of mitral valve pathology? 3. Surgical changes of prior median sternotomy. 4. Mild vascular congestion without overt edema. Electronically Signed   By: Malachy Moan M.D.   On: 10/17/2015 16:41    Microbiology: Recent Results (from the past 240 hour(s))  MRSA PCR Screening     Status: None   Collection Time: 10/17/15  6:40 PM  Result Value Ref Range Status   MRSA by PCR NEGATIVE NEGATIVE Final    Comment:        The GeneXpert MRSA Assay (FDA approved for NASAL specimens only), is one component of a comprehensive MRSA colonization surveillance program. It is not intended to diagnose MRSA infection nor to guide or monitor treatment for MRSA infections.      Labs: Basic Metabolic Panel:  Recent Labs Lab 10/17/15 1556 10/19/15 0421 10/20/15 2051  NA 140 140 135  K 3.9 4.0 4.0  CL 102 104 104  CO2 GLUCOSE 123* 103* 104*  BUN 24* 16 20  CREATININE 1.30* 1.13 1.01  CALCIUM 10.2 9.1 9.0  MG 2.0  --  2.1   Liver  Function Tests:  Recent Labs Lab 10/17/15 1556  AST 34  ALT 29  ALKPHOS 33*  BILITOT 1.0  PROT 7.6  ALBUMIN 4.7    CBC:  Recent Labs Lab 10/17/15 1556 10/18/15 0312 10/19/15 0421  WBC 7.7 8.9 7.9  NEUTROABS 3.9  --   --   HGB 16.1 16.1 15.9  HCT 48.9 47.7 47.4  MCV 94.0 93.3 94.2  PLT 236 234 231   Cardiac Enzymes:  Recent Labs Lab 10/18/15 1301  TROPONINI 0.06*   Principal Problem:   Atrial fibrillation with rapid ventricular response (HCC) Active Problems:   Obstructive sleep apnea   H/O hypertrophic cardiomyopathy - s/p myectomy surgery 2010   AKI (acute kidney injury) (HCC)   Time coordinating discharge: 35 minutes  Signed:  Brendia Sacks, MD [facility] 10/21/2015, 8:51 AM   By signing my name below, I, Burnett Harry attest that this documentation has been prepared under the direction and in  the presence of Brendia Sacks, MD Electronically signed: Burnett Harry, Scribe. 10/21/2015  I personally performed the services described in this documentation. All medical record entries made by the scribe were at my direction. I have reviewed the chart and agree that the record reflects my personal performance and is accurate and complete. Brendia Sacks, MD

## 2015-10-21 NOTE — Progress Notes (Signed)
Rates 80-90s this afternoon, oral dilt increased to  bid. From cardiac standpoint we are ok with discharge on Toprol XL  bid, diltazem short acting  bid, and xarelto  daily. He will have f/u in 2 weeks in our clinic. Counseled ok to take additional dilt  if palpitations.    Dominga Ferry MD

## 2015-10-21 NOTE — Progress Notes (Signed)
ANTICOAGULATION CONSULT NOTE - Follow Up Consult  Pharmacy Consult for xarelto Indication: atrial fibrillation  Allergies  Allergen Reactions  . Vicodin [Hydrocodone-Acetaminophen] Nausea And Vomiting  . Latex Rash    Patient Measurements: Height:  (185.4 cm) Weight: 248 lb 7.3 oz (112.7 kg) IBW/kg (Calculated) : 79.9   Vital Signs: Temp: 96.7 F (35.9 C) (02/17 0700) Temp Source: Axillary (02/17 0700) BP: 119/78 mmHg (02/17 0745) Pulse Rate: 101 (02/17 0745)  Labs:  Recent Labs  10/18/15 1301 10/19/15 0421 10/20/15 2051  HGB  --  15.9  --   HCT  --  47.4  --   PLT  --  231  --   CREATININE  --  1.13 1.01  TROPONINI 0.06*  --   --     Estimated Creatinine Clearance: 122.8 mL/min (by C-G formula based on Cr of 1.01).   Medications:  Prescriptions prior to admission  Medication Sig Dispense Refill Last Dose  . aspirin EC 81 MG tablet Take 81 mg by mouth daily.   10/17/2015 at Unknown time  . BYSTOLIC 5 MG tablet Take 5 mg by mouth daily.    10/17/2015 at Unknown time  . fenofibrate 160 MG tablet TAKE (1) TABLET BY MOUTH ONCE DAILY. 30 tablet 5 10/16/2015 at Unknown time  . pravastatin (PRAVACHOL) 40 MG tablet Take 1 tablet (40 mg total) by mouth daily. 30 tablet 5 10/16/2015 at Unknown time  . triamcinolone cream (KENALOG) 0.1 % Apply 1 application topically daily as needed.    unknown at Unknown time    Assessment: 45 yo man started on xarelto for afib.  No bleeding complications noted Goal of Therapy:  Prevention of stroke Monitor platelets by anticoagulation protocol: Yes   Plan:  Cont xarelto 20 mg po daily  Talbert Cage Poteet 10/21/2015,12:03 PM

## 2015-10-21 NOTE — Progress Notes (Signed)
D.c instructions reviewed with patient and wife. Verbalized understanding. Pt dc'd to home with wife. 

## 2015-10-23 ENCOUNTER — Telehealth: Payer: Self-pay | Admitting: Nurse Practitioner

## 2015-10-23 NOTE — Telephone Encounter (Signed)
   Pt called stating that on dilt 60 bid and toprol 100 bid, he has felt somewhat fatigued during the day and has been waking up suddenly at night feeling sob.  He does have OSA but wears CPAP.  He feels that he isn't tolerating the toprol.  He has historically been on bystolic.  He'd like to try and cut back his bb.  I advised that currently, the two drugs are working to keep his HR reasonable and that any change in one would likely require titration of the other.  I told him that I didn't think that either drug was causing his Ss but that if he wished to change up his regimen, he may reduce his toprol xl to 100 mg daily and titrate his dilt to 60 TID.  He may ultimately require dilt 60 QID.  He also asked for an [place] Rx, saying that his wife feels that he is probably having panic attacks at night (h/o anxiety).  I rec that he contact his PCP for a sleep aid.  Caller verbalized understanding and was grateful for the call back.  Nicolasa Ducking, NP 10/23/2015, 10:09 AM

## 2015-10-24 ENCOUNTER — Encounter: Payer: Self-pay | Admitting: Family Medicine

## 2015-10-24 ENCOUNTER — Telehealth: Payer: Self-pay

## 2015-10-24 NOTE — Telephone Encounter (Signed)
Called to check on pt. He states that he is feeling a lot better today than he did over the weekend. He said he would keep [place] updated on how the medication change worked for him.

## 2015-10-24 NOTE — Telephone Encounter (Signed)
Please see how patient is doing today, and let him know I am ok with med changes recommended by NP Brion Aliment, specifically cutting Toprol XL to  daily and increasing dilt to  three times a day.   Dominga Ferry MD

## 2015-11-07 ENCOUNTER — Encounter: Payer: BC Managed Care – PPO | Admitting: Adult Health

## 2015-11-09 ENCOUNTER — Encounter: Payer: Self-pay | Admitting: Family Medicine

## 2016-02-07 DIAGNOSIS — Z029 Encounter for administrative examinations, unspecified: Secondary | ICD-10-CM

## 2016-03-28 ENCOUNTER — Encounter: Payer: Self-pay | Admitting: Family Medicine

## 2016-03-28 ENCOUNTER — Ambulatory Visit: Payer: BC Managed Care – PPO | Admitting: Family Medicine

## 2016-09-08 IMAGING — CR DG CHEST 1V PORT
1 series · 1 of 1 positions shown · non-contrast
Comparison: Prior chest x-ray 12/06/2006

CLINICAL DATA: 44-year-old male with dizziness and shortness of
breath for the past 3 days. Past medical history includes
cardiomyopathy, heart failure and heart surgery in [address]68.

EXAM:
PORTABLE CHEST 1 VIEW

[ap portable]
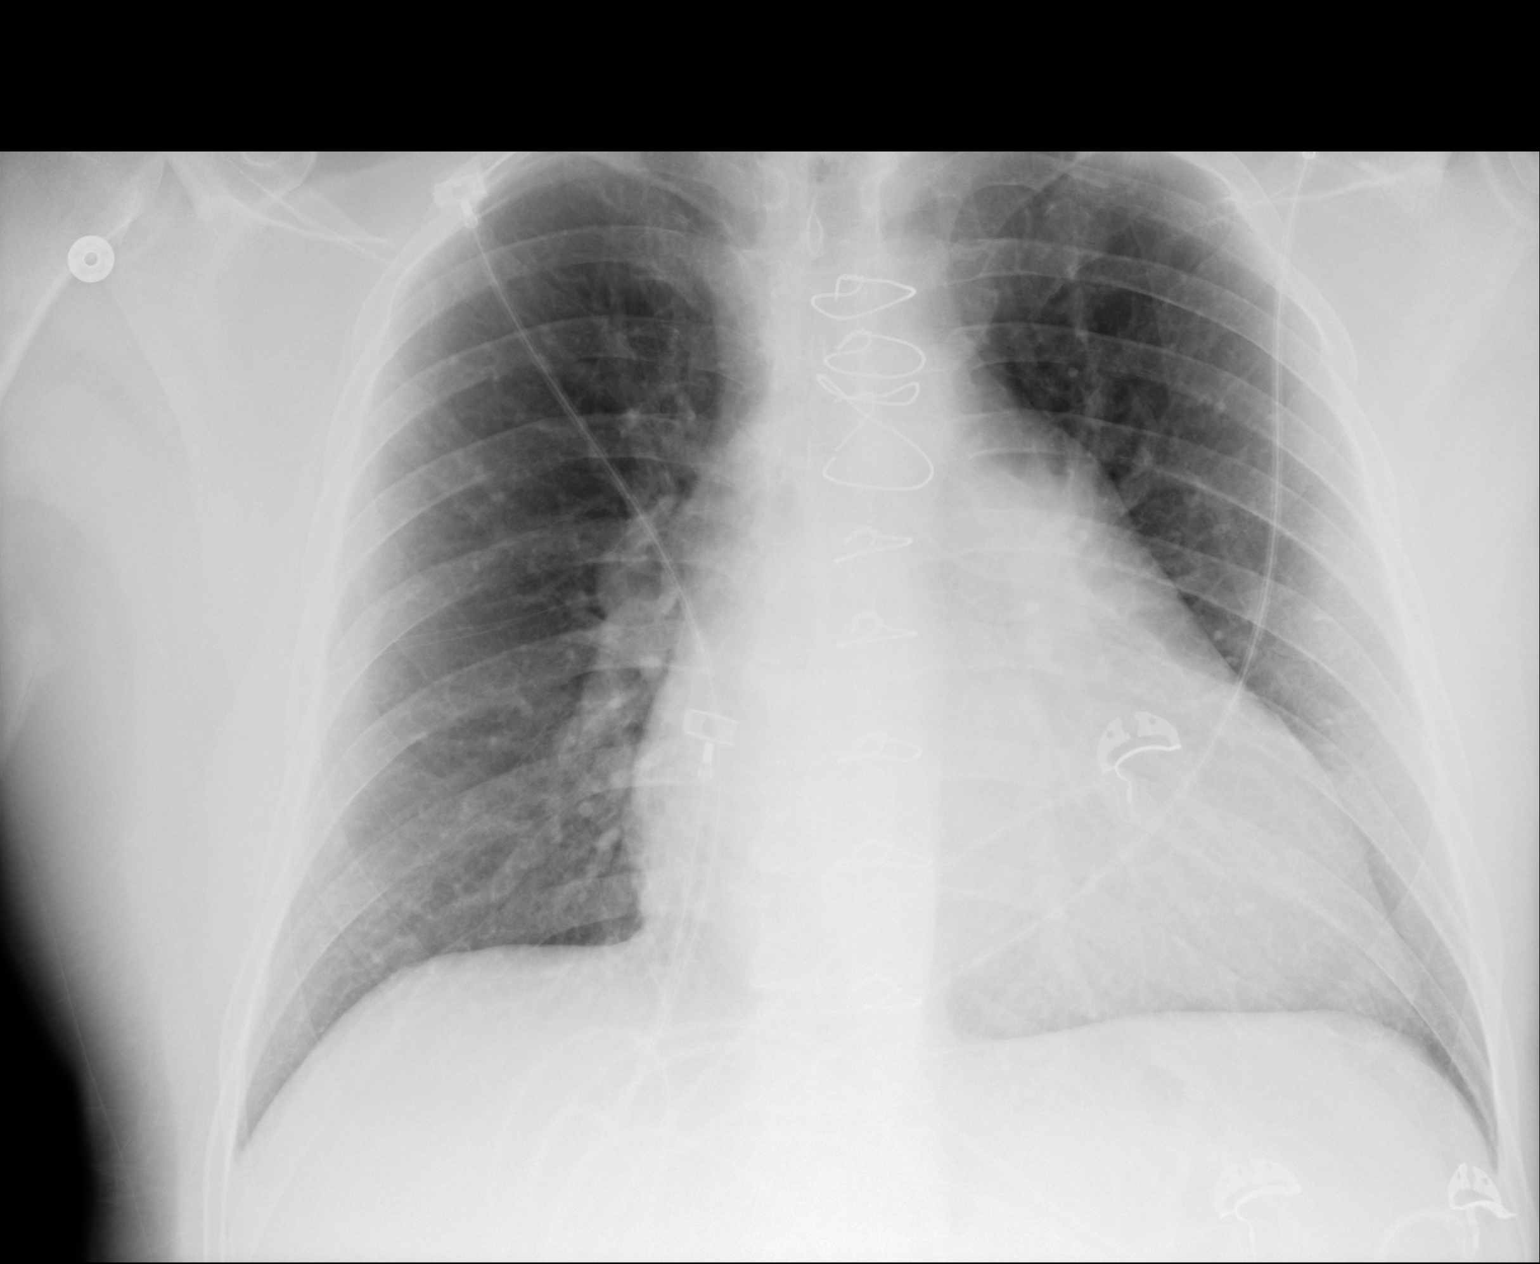

[1 of 1 positions shown; findings below may reference images not displayed]

FINDINGS: Progressive cardiomegaly with enlargement of the left atrial
appendage. Patient is status post median sternotomy. There is a
fracture of the third sternal wire from the top. The fracture does
not appear displaced. The remaining sternal wires are intact. Mild
vascular congestion without overt edema. No pneumothorax or pleural
effusion. No acute osseous abnormality.
IMPRESSION: 1. Progressive cardiomegaly compared to 12/06/2006.
2. Probable enlargement of the left atrial appendage. Does the
patient have a clinical history of mitral valve pathology?
3. Surgical changes of prior median sternotomy.
4. Mild vascular congestion without overt edema.

## 2017-08-19 ENCOUNTER — Other Ambulatory Visit (HOSPITAL_COMMUNITY): Payer: Self-pay | Admitting: Internal Medicine

## 2017-08-19 DIAGNOSIS — R109 Unspecified abdominal pain: Secondary | ICD-10-CM

## 2017-08-22 ENCOUNTER — Encounter: Payer: Self-pay | Admitting: Internal Medicine

## 2017-08-23 ENCOUNTER — Ambulatory Visit (HOSPITAL_COMMUNITY): Payer: BC Managed Care – PPO

## 2017-09-02 ENCOUNTER — Ambulatory Visit (HOSPITAL_COMMUNITY): Payer: BC Managed Care – PPO

## 2017-09-11 ENCOUNTER — Ambulatory Visit (HOSPITAL_COMMUNITY): Admission: RE | Admit: 2017-09-11 | Payer: BC Managed Care – PPO | Source: Ambulatory Visit

## 2017-10-16 ENCOUNTER — Encounter: Payer: Self-pay | Admitting: Nurse Practitioner

## 2017-10-16 ENCOUNTER — Telehealth: Payer: Self-pay | Admitting: Nurse Practitioner

## 2017-10-16 ENCOUNTER — Ambulatory Visit: Payer: BC Managed Care – PPO | Admitting: Nurse Practitioner

## 2017-10-16 NOTE — Telephone Encounter (Signed)
PATIENT WAS A NO SHOW AND LETTER SENT  °

## 2017-10-17 NOTE — Telephone Encounter (Signed)
Noted  

## 2017-11-12 ENCOUNTER — Encounter: Payer: Self-pay | Admitting: Nurse Practitioner

## 2017-11-21 ENCOUNTER — Other Ambulatory Visit (HOSPITAL_COMMUNITY): Payer: Self-pay | Admitting: Internal Medicine

## 2017-11-21 DIAGNOSIS — R109 Unspecified abdominal pain: Secondary | ICD-10-CM

## 2017-11-21 DIAGNOSIS — R140 Abdominal distension (gaseous): Secondary | ICD-10-CM

## 2017-11-29 ENCOUNTER — Ambulatory Visit (HOSPITAL_COMMUNITY)
Admission: RE | Admit: 2017-11-29 | Discharge: 2017-11-29 | Disposition: A | Discharge status: Home / Self Care | Payer: BC Managed Care – PPO | Source: Ambulatory Visit | Attending: Internal Medicine | Admitting: Internal Medicine

## 2017-11-29 DIAGNOSIS — R932 Abnormal findings on diagnostic imaging of liver and biliary tract: Secondary | ICD-10-CM | POA: Insufficient documentation

## 2017-11-29 DIAGNOSIS — R140 Abdominal distension (gaseous): Secondary | ICD-10-CM | POA: Insufficient documentation

## 2017-11-29 DIAGNOSIS — R109 Unspecified abdominal pain: Secondary | ICD-10-CM | POA: Insufficient documentation

## 2017-12-31 ENCOUNTER — Encounter: Payer: Self-pay | Admitting: Internal Medicine

## 2018-03-25 ENCOUNTER — Ambulatory Visit: Payer: BC Managed Care – PPO | Admitting: Nurse Practitioner

## 2018-03-25 ENCOUNTER — Encounter: Payer: Self-pay | Admitting: Nurse Practitioner

## 2018-03-25 VITALS — BP 119/86 | HR 77 | Temp 97.3°F | Ht 73.0 in | Wt 255.6 lb

## 2018-03-25 DIAGNOSIS — R1084 Generalized abdominal pain: Secondary | ICD-10-CM | POA: Diagnosis not present

## 2018-03-25 DIAGNOSIS — R109 Unspecified abdominal pain: Secondary | ICD-10-CM | POA: Insufficient documentation

## 2018-03-25 DIAGNOSIS — K219 Gastro-esophageal reflux disease without esophagitis: Secondary | ICD-10-CM

## 2018-03-25 NOTE — Progress Notes (Signed)
Referring Provider: Benita Stabile, MD Primary Care Physician:  Benita Stabile, MD Primary GI:  Dr. Jena Gauss  Chief Complaint  Patient presents with  . Abdominal Pain    Upper abd, persistant, worse when he eats. getting worse for the past 6 months  . diarrhea/constipation    HPI:   Casey Holland is a 47 y.o. male who presents on referral from primary care for abdominal pain.  The patient's initial new patient appointment he was a no-show and subsequently his referral information was lost.  No history of colonoscopy or endoscopy in our system.  Today he states he's ok overall. He is having upper abdominal and naval abdominal pain. This is worse after eating. Mild/persistent pressure is constant, cannot identify specific triggers. Pain worsens if he exerts himself and improves if he rests. Food significantly exacerbates his symptoms; the more food, the worse. "The best I feel is when I don't eat anything." Pain is "pressure." Denies N/V. Has a bowel movement about 1-5 times a day, never a "complete movement." Typically Bristol 6. No significant straining. Denies hematochezia, melena, fever, chills, unintentional weight loss. Has been using GasX Ultra for his pain, sometimes TUMS as well; this has helped somewhat. He is on Protonix daily. Has a history of GERD and dysphagia, no dysphagia while on Protonix. Denies chest pain, dyspnea, dizziness, lightheadedness, syncope, near syncope. Denies any other upper or lower GI symptoms.  History of AFib s/p successful ablation. Has a PPM.  Past Medical History:  Diagnosis Date  . A-fib [facility])    s/p sucessful ablation  . Asthma   . Cardiomyopathy, hypertrophic nonobstructive (HCC)    subaortic valve gradient of with recent congestive heart failure and volume overload, now improved with diuretics  . Heart failure 10/2015   Class III/IV pre 2010 surgery > postop has no CHF    Past Surgical History:  Procedure Laterality Date  . CARDIAC  SURGERY  2010   Myoectomy    Current Outpatient Medications  Medication Sig Dispense Refill  . BYSTOLIC 5 MG tablet Take 1 tablet by mouth daily.    . pantoprazole (PROTONIX) 40 MG tablet Take 1 tablet by mouth daily.    . pravastatin (PRAVACHOL) 40 MG tablet Take 1 tablet (40 mg total) by mouth daily. 30 tablet 5  . rosuvastatin (CRESTOR) 10 MG tablet Take 1 tablet by mouth daily.     No current facility-administered medications for this visit.     Allergies as of 03/25/2018 - Review Complete 03/25/2018  Allergen Reaction Noted  . Vicodin [hydrocodone-acetaminophen] Nausea And Vomiting 10/17/2015  . Latex Rash     Family History  Problem Relation Age of Onset  . Cardiomyopathy Father        HOCM  . Esophageal cancer Father   . Cardiomyopathy Brother        HOCM  . Atrial fibrillation Brother        Deceased   . Colon cancer Neg Hx   . Gastric cancer Neg Hx     Social History   Socioeconomic History  . Marital status: Married    Spouse name: Not on file  . Number of children: Not on file  . Years of education: Not on file  . Highest education level: Not on file  Occupational History  . Occupation: Runner, broadcasting/film/video    Comment: high school chemi  Social Needs  . Financial resource strain: Not on file  . Food insecurity:    Worry:  Not on file    Inability: Not on file  . Transportation needs:    Medical: Not on file    Non-medical: Not on file  Tobacco Use  . Smoking status: Never Smoker  . Smokeless tobacco: Never Used  Substance and Sexual Activity  . Alcohol use: No  . Drug use: Never  . Sexual activity: Not on file  Lifestyle  . Physical activity:    Days per week: Not on file    Minutes per session: Not on file  . Stress: Not on file  Relationships  . Social connections:    Talks on phone: Not on file    Gets together: Not on file    Attends religious service: Not on file    Active member of club or organization: Not on file    Attends meetings of clubs  or organizations: Not on file    Relationship status: Not on file  Other Topics Concern  . Not on file  Social History Narrative  . Not on file    Review of Systems: General: Negative for anorexia, weight loss, fever, chills, fatigue, weakness. ENT: Negative for hoarseness, difficulty swallowing. CV: Negative for chest pain, angina, palpitations, peripheral edema.  Respiratory: Negative for dyspnea at rest, cough, sputum, wheezing.  GI: See history of present illness. MS: Negative for joint pain, low back pain.  Derm: Negative for rash or itching.  Endo: Negative for unusual weight change.  Heme: Negative for bruising or bleeding. Allergy: Negative for rash or hives.   Physical Exam: BP 119/86   Pulse 77   Temp (!) 97.3 F (36.3 C) (Oral)   Ht 6\' 1"  (1.854 m)   Wt 255 lb 9.6 oz (115.9 kg)   BMI 33.72 kg/m  General:   Alert and oriented. Pleasant and cooperative. Well-nourished and well-developed.  Eyes:  Without icterus, sclera clear and conjunctiva pink.  Ears:  Normal auditory acuity. Cardiovascular:  S1, S2 present without murmurs appreciated. Extremities without clubbing or edema. Respiratory:  Clear to auscultation bilaterally. No wheezes, rales, or rhonchi. No distress.  Gastrointestinal:  +BS, soft, non-tender and non-distended. No HSM noted. No guarding or rebound. No masses appreciated.  Rectal:  Deferred  Musculoskalatal:  Symmetrical without gross deformities. Neurologic:  Alert and oriented x4;  grossly normal neurologically. Psych:  Alert and cooperative. Normal mood and affect. Heme/Lymph/Immune: No excessive bruising noted.    03/25/2018 8:39 AM   Disclaimer: This note was dictated with voice recognition software. Similar sounding words can inadvertently be transcribed and may not be corrected upon review.

## 2018-03-25 NOTE — Patient Instructions (Addendum)
1. Have your breath test completed at the lab when you collected. 2. You will need to be off Protonix for 2 weeks prior to your breath test. 3. When you collect your stool sample and bring into the lab, restart your acid blocker using Dexilant samples. 4. I am giving you samples of Dexilant to last 2 weeks. 5. Call us in 1 to 2 weeks and let us know if Dexilant is working any better for your symptoms. 6. It is very important that she contact your cardiologist to discuss your "sternal pressure" and abdominal pain that is worse with exertion. 7. Follow-up in 2 months. 8. Call us if you have any questions or concerns.  At [facility] we value your feedback. You may receive a survey about your visit today. Please share your experience as we strive to create trusting relationships with our patients to provide genuine, compassionate, quality care.  It was great to meet you today!  I hope you have a great summer!!

## 2018-03-28 NOTE — Assessment & Plan Note (Signed)
Acute on chronic reflux symptoms.  He is currently on Protonix.  Given the acute onset of his worsening symptoms I will have him hold his PPI for 2 weeks.  We will check a hydrogen breath test for H. pylori.  If positive we can proceed with treatment.  Given his symptoms and his cardiac history I recommended he contact cardiology to further discuss and ensure there is no need for cardiac work-up.  When he restarts PPI, I will start him back on Dexilant and samples were provided.  Request progress report 1 to 2 weeks after starting Dexilant.  Call if any inability to tolerate no PPI.  Follow-up in 2 months.

## 2018-03-28 NOTE — Assessment & Plan Note (Signed)
The patient describes upper abdominal pain worse after eating.  Also mild/persistent pressure which is constant and cannot identify specific triggers.  He notes the pain worsens if he exerts himself and improves if he rests.  He has a significant cardiac history.  Likely acute on chronic GERD.  We are checking for H. pylori infection via breath testing, as per above.  I recommended he contact his cardiologist as well given his symptoms and his cardiac history.  Follow-up in 2 months.

## 2018-03-31 NOTE — Progress Notes (Signed)
CC'D TO PCP °

## 2018-04-14 LAB — H. PYLORI BREATH TEST: H. pylori Breath Test: NOT DETECTED

## 2018-05-30 ENCOUNTER — Ambulatory Visit: Payer: BC Managed Care – PPO | Admitting: Nurse Practitioner

## 2018-05-30 ENCOUNTER — Encounter: Payer: Self-pay | Admitting: *Deleted

## 2018-05-30 ENCOUNTER — Encounter: Payer: Self-pay | Admitting: Nurse Practitioner

## 2018-05-30 VITALS — BP 120/74 | HR 62 | Temp 97.0°F | Ht 73.0 in | Wt 252.2 lb

## 2018-05-30 DIAGNOSIS — R1084 Generalized abdominal pain: Secondary | ICD-10-CM | POA: Diagnosis not present

## 2018-05-30 DIAGNOSIS — R140 Abdominal distension (gaseous): Secondary | ICD-10-CM | POA: Diagnosis not present

## 2018-05-30 DIAGNOSIS — K5900 Constipation, unspecified: Secondary | ICD-10-CM | POA: Diagnosis not present

## 2018-05-30 DIAGNOSIS — R194 Change in bowel habit: Secondary | ICD-10-CM

## 2018-05-30 NOTE — Patient Instructions (Signed)
1. We will help schedule your CT for you. 2. We will call you with the results of your CT scan. 3. I am giving you samples of Linzess 72 mcg.  Depending on results of your CT scan, if we tell you, start taking this once a day on an empty stomach.  Call with a progress report 1 to 2 weeks after you start taking it. 4. We will have you follow-up in 2 months to check on your progress. 5. Call us if you have any questions or concerns.  At [facility] we value your feedback. You may receive a survey about your visit today. Please share your experience as we strive to create trusting relationships with our patients to provide genuine, compassionate, quality care.  We appreciate your understanding and patience as we review any laboratory studies, imaging, and other diagnostic tests that are ordered as we care for you. Our office policy is 5 business days for review of these results, and any emergent or urgent results are addressed in a timely manner for your best interest. If you do not hear from our office in 1 week, please contact us.   We also encourage the use of MyChart, which contains your medical information for your review as well. If you are not enrolled in this feature, an access code is on this after visit summary for your convenience. Thank you for allowing [place] to be involved in your care.  It was great to see you today!  I hope you have a great Fall!!

## 2018-05-30 NOTE — Progress Notes (Signed)
Referring Provider: Benita Stabile, MD Primary Care Physician:  Benita Stabile, MD Primary GI:  Dr. Jena Gauss  Chief Complaint  Patient presents with  . Abdominal Pain    "pressure"    HPI:   Casey Holland is a 47 y.o. male who presents for follow-up on abdominal pain and gastritis.  Patient was last seen in our office 03/25/2018 for GERD and abdominal pain.  At his last visit he was referred by primary care.  He is having upper abdominal and navel abdominal pain worse after eating with mild/persistent pressure that is constant and cannot identify specific triggers.  Food significantly exacerbates this.  Denies nausea vomiting.  Never complete bowel movement, typically Bristol 6.  No straining.  Has been using Gas-X for his pain and sometimes Tums which is helped somewhat.  On Protonix daily.  History of GERD and dysphasia, no dysphasia while on Protonix.  Struve A. fib status post successful ablation and permanent pacemaker in place.  Recommended H. pylori breath testing when off of Protonix for 2 weeks.  When testing complete start Dexilant with samples provided for 2 weeks.  Request progress report in 1 to 2 weeks to inform us of improvement or worsening on Dexilant.  Follow-up in 2 months.  H. pylori breath test was negative.  At that time he noted Dexilant is working to some degree but still some lower stomach issues.  He stated he would discuss this at his follow-up visit.  Today he states he's doing ok overall. Dexilant did ok, but not noticeably better than Protonix. Went back to [employer]. Denies GERD symptoms. Still mid lower abdominal discomfort/pressure. Has a bowel movement variable. Stools are small, harder to pass, some straining. Occasionally sticky. His lower abdominal discomfort improves after a 'good" bowel movement. Decreased appetite. His current stools are a change for him. Denies N/V, hematochezia, melena, fever, chills, unintentional weight loss. Denies chest pain, dyspnea,  dizziness, lightheadedness, syncope, near syncope. Denies any other upper or lower GI symptoms.  Has never had a colonoscopy before.  Past Medical History:  Diagnosis Date  . A-fib [facility])    s/p sucessful ablation  . Asthma   . Cardiomyopathy, hypertrophic nonobstructive (HCC)    subaortic valve gradient of with recent congestive heart failure and volume overload, now improved with diuretics  . Heart failure 10/2015   Class III/IV pre 2010 surgery > postop has no CHF    Past Surgical History:  Procedure Laterality Date  . CARDIAC SURGERY  2010   Myoectomy    Current Outpatient Medications  Medication Sig Dispense Refill  . BYSTOLIC 5 MG tablet Take 1 tablet by mouth daily.    . pantoprazole (PROTONIX) 40 MG tablet Take 1 tablet by mouth daily.    . rosuvastatin (CRESTOR) 10 MG tablet Take 1 tablet by mouth daily.     No current facility-administered medications for this visit.     Allergies as of 05/30/2018 - Review Complete 05/30/2018  Allergen Reaction Noted  . Vicodin [hydrocodone-acetaminophen] Nausea And Vomiting 10/17/2015  . Latex Rash     Family History  Problem Relation Age of Onset  . Cardiomyopathy Father        HOCM  . Esophageal cancer Father   . Cardiomyopathy Brother        HOCM  . Atrial fibrillation Brother        Deceased   . Colon cancer Neg Hx   . Gastric cancer Neg Hx  Social History   Socioeconomic History  . Marital status: Married    Spouse name: Not on file  . Number of children: Not on file  . Years of education: Not on file  . Highest education level: Not on file  Occupational History  . Occupation: Runner, broadcasting/film/video    Comment: high school chemi  Social Needs  . Financial resource strain: Not on file  . Food insecurity:    Worry: Not on file    Inability: Not on file  . Transportation needs:    Medical: Not on file    Non-medical: Not on file  Tobacco Use  . Smoking status: Never Smoker  . Smokeless tobacco: Never Used    Substance and Sexual Activity  . Alcohol use: No  . Drug use: Never  . Sexual activity: Not on file  Lifestyle  . Physical activity:    Days per week: Not on file    Minutes per session: Not on file  . Stress: Not on file  Relationships  . Social connections:    Talks on phone: Not on file    Gets together: Not on file    Attends religious service: Not on file    Active member of club or organization: Not on file    Attends meetings of clubs or organizations: Not on file    Relationship status: Not on file  Other Topics Concern  . Not on file  Social History Narrative  . Not on file    Review of Systems: General: Negative for anorexia, weight loss, fever, chills, fatigue, weakness. Eyes: Negative for vision changes.  ENT: Negative for hoarseness, difficulty swallowing , nasal congestion. CV: Negative for chest pain, angina, palpitations, dyspnea on exertion, peripheral edema.  Respiratory: Negative for dyspnea at rest, dyspnea on exertion, cough, sputum, wheezing.  GI: See history of present illness. GU:  Negative for dysuria, hematuria, urinary incontinence, urinary frequency, nocturnal urination.  MS: Negative for joint pain, low back pain.  Derm: Negative for rash or itching.  Neuro: Negative for weakness, abnormal sensation, seizure, frequent headaches, memory loss, confusion.  Psych: Negative for anxiety, depression, suicidal ideation, hallucinations.  Endo: Negative for unusual weight change.  Heme: Negative for bruising or bleeding. Allergy: Negative for rash or hives.   Physical Exam: BP 120/74   Pulse 62   Temp (!) 97 F (36.1 C) (Oral)   Ht 6\' 1"  (1.854 m)   Wt 252 lb 3.2 oz (114.4 kg)   BMI 33.27 kg/m  General:   Alert and oriented. Pleasant and cooperative. Well-nourished and well-developed.  Head:  Normocephalic and atraumatic. Eyes:  Without icterus, sclera clear and conjunctiva pink.  Ears:  Normal auditory acuity. Mouth:  No deformity or lesions,  oral mucosa pink.  Throat/Neck:  Supple, without mass or thyromegaly. Cardiovascular:  S1, S2 present without murmurs appreciated. Normal pulses noted. Extremities without clubbing or edema. Respiratory:  Clear to auscultation bilaterally. No wheezes, rales, or rhonchi. No distress.  Gastrointestinal:  +BS, soft, non-tender and non-distended. No HSM noted. No guarding or rebound. No masses appreciated.  Rectal:  Deferred  Musculoskalatal:  Symmetrical without gross deformities. Normal posture. Skin:  Intact without significant lesions or rashes. Neurologic:  Alert and oriented x4;  grossly normal neurologically. Psych:  Alert and cooperative. Normal mood and affect. Heme/Lymph/Immune: No significant cervical adenopathy. No excessive bruising noted.    05/30/2018 11:29 AM   Disclaimer: This note was dictated with voice recognition software. Similar sounding words can inadvertently be transcribed  and may not be corrected upon review.

## 2018-06-02 NOTE — Assessment & Plan Note (Signed)
Constipation with small, hard stools with some straining.  Stools are occasionally sticky.  Is associated with lower abdominal pain.  We will start him on Linzess 72 mcg if his CT is unremarkable.  We will request a progress report in 1 to 2 weeks if he starts Linzess.  Return for follow-up in 2 months.

## 2018-06-02 NOTE — Progress Notes (Signed)
CC'D TO PCP °

## 2018-06-02 NOTE — Assessment & Plan Note (Signed)
He has had some worsening constipation as of late.  His stools are occasionally sticky.  This is associated with abdominal discomfort that improves after a good bowel movement.  Decreased appetite as well.  These stool characteristics represented change for him.  He is 47 years old and is never had a colonoscopy.  We will manage his constipation as per above, check abdominal cross-sectional imaging.  Follow-up in 2 months and consider colonoscopy at that time.

## 2018-06-02 NOTE — Assessment & Plan Note (Signed)
The patient notes some mid lower abdominal discomfort/pressure associated with what seems to be constipation.  His abdominal discomfort is improved after a "good" bowel movement.  Likely IBS or constipation related pain.  We will start him on better constipation management as per above.  Follow-up in 2 months and consider colonoscopy depending on progress.

## 2018-06-03 ENCOUNTER — Telehealth: Payer: Self-pay | Admitting: *Deleted

## 2018-06-03 NOTE — Telephone Encounter (Signed)
PA submitted for CT abd/pelvis vis [employer]. PA was approved. Order # [id number]. Dates 06/03/18-07/02/18.

## 2018-06-18 ENCOUNTER — Ambulatory Visit (HOSPITAL_COMMUNITY)
Admission: RE | Admit: 2018-06-18 | Discharge: 2018-06-18 | Disposition: A | Discharge status: Home / Self Care | Payer: BC Managed Care – PPO | Source: Ambulatory Visit | Attending: Nurse Practitioner | Admitting: Nurse Practitioner

## 2018-06-18 DIAGNOSIS — R140 Abdominal distension (gaseous): Secondary | ICD-10-CM | POA: Diagnosis present

## 2018-06-18 DIAGNOSIS — I517 Cardiomegaly: Secondary | ICD-10-CM | POA: Diagnosis not present

## 2018-06-18 MED ORDER — IOPAMIDOL (ISOVUE-300) INJECTION 61%
100.0000 mL | Freq: Once | INTRAVENOUS | Status: AC | PRN
  Administered 2018-06-18: 100 mL via INTRAVENOUS

## 2018-08-29 ENCOUNTER — Encounter: Payer: Self-pay | Admitting: Nurse Practitioner

## 2018-08-29 ENCOUNTER — Ambulatory Visit: Payer: BC Managed Care – PPO | Admitting: Nurse Practitioner

## 2018-08-29 NOTE — Progress Notes (Deleted)
Referring Provider: Benita StabileHall, John Z, MD Primary Care Physician:  Benita StabileHall, John Z, MD Primary GI:  Dr.   Bonnetta BarryNo chief complaint on file.   HPI:   Casey Holland is a 47 y.o. male who presents for follow-up on abdominal pain and constipation.  The patient was last seen in our office 05/30/2018 for the same as well as abdominal distention and change in stool habits.  Noted upper abdominal and navel pain previously with worsening due to food intake.  No nausea or vomiting.  Constipation with sensation of incomplete emptying.  On Protonix daily.  History of GERD and dysphagia with no dysphasia while on Protonix.  H. pylori breath test was negative and he was changed to Dexilant which she noted some improvement overall.  Dexilant was noticeably better than Protonix at his last visit.  However, he did go back to Protonix and is not having GERD symptoms.  Still with mild lower abdominal discomfort and variable stools which tend to be small, hard to pass, some straining.  Occasionally sticky.  Improvement in abdominal pain after a "good" bowel movement.  This is a significant change in his stool pattern for him.  Denies any other GI symptoms.  No previous colonoscopy.  Recommended CT of the abdomen and pelvis and if unremarkable start starting Linzess 72 mcg with a 1 to 2-week progress report.  CT of the abdomen and pelvis with contrast was completed 06/18/2018 which found no acute findings in the abdomen or pelvis.  Noted cardiomegaly with pacemaker wires in the right heart.  Recommended start Linzess, as previously discussed.  No progress report was called our office  Today he states   Past Medical History:  Diagnosis Date  . A-fib [facility](HCC)    s/p sucessful ablation  . Asthma   . Cardiomyopathy, hypertrophic nonobstructive (HCC)    subaortic valve gradient of 80mmHG with recent congestive heart failure and volume overload, now improved with diuretics  . Heart failure 10/2015   Class III/IV pre 2010  surgery > postop has no CHF    Past Surgical History:  Procedure Laterality Date  . CARDIAC SURGERY  2010   Myoectomy    Current Outpatient Medications  Medication Sig Dispense Refill  . BYSTOLIC 5 MG tablet Take 1 tablet by mouth daily.    . pantoprazole (PROTONIX) 40 MG tablet Take 1 tablet by mouth daily.    . rosuvastatin (CRESTOR) 10 MG tablet Take 1 tablet by mouth daily.     No current facility-administered medications for this visit.     Allergies as of 08/29/2018 - Review Complete 05/30/2018  Allergen Reaction Noted  . Vicodin [hydrocodone-acetaminophen] Nausea And Vomiting 10/17/2015  . Latex Rash     Family History  Problem Relation Age of Onset  . Cardiomyopathy Father        HOCM  . Esophageal cancer Father   . Cardiomyopathy Brother        HOCM  . Atrial fibrillation Brother        Deceased   . Colon cancer Neg Hx   . Gastric cancer Neg Hx     Social History   Socioeconomic History  . Marital status: Married    Spouse name: Not on file  . Number of children: Not on file  . Years of education: Not on file  . Highest education level: Not on file  Occupational History  . Occupation: Runner, broadcasting/film/videoteacher    Comment: high school chemi  Social Needs  . Financial  resource strain: Not on file  . Food insecurity:    Worry: Not on file    Inability: Not on file  . Transportation needs:    Medical: Not on file    Non-medical: Not on file  Tobacco Use  . Smoking status: Never Smoker  . Smokeless tobacco: Never Used  Substance and Sexual Activity  . Alcohol use: No  . Drug use: Never  . Sexual activity: Not on file  Lifestyle  . Physical activity:    Days per week: Not on file    Minutes per session: Not on file  . Stress: Not on file  Relationships  . Social connections:    Talks on phone: Not on file    Gets together: Not on file    Attends religious service: Not on file    Active member of club or organization: Not on file    Attends meetings of clubs  or organizations: Not on file    Relationship status: Not on file  Other Topics Concern  . Not on file  Social History Narrative  . Not on file    Review of Systems: General: Negative for anorexia, weight loss, fever, chills, fatigue, weakness. Eyes: Negative for vision changes.  ENT: Negative for hoarseness, difficulty swallowing , nasal congestion. CV: Negative for chest pain, angina, palpitations, dyspnea on exertion, peripheral edema.  Respiratory: Negative for dyspnea at rest, dyspnea on exertion, cough, sputum, wheezing.  GI: See history of present illness. GU:  Negative for dysuria, hematuria, urinary incontinence, urinary frequency, nocturnal urination.  MS: Negative for joint pain, low back pain.  Derm: Negative for rash or itching.  Neuro: Negative for weakness, abnormal sensation, seizure, frequent headaches, memory loss, confusion.  Psych: Negative for anxiety, depression, suicidal ideation, hallucinations.  Endo: Negative for unusual weight change.  Heme: Negative for bruising or bleeding. Allergy: Negative for rash or hives.   Physical Exam: There were no vitals taken for this visit. General:   Alert and oriented. Pleasant and cooperative. Well-nourished and well-developed.  Head:  Normocephalic and atraumatic. Eyes:  Without icterus, sclera clear and conjunctiva pink.  Ears:  Normal auditory acuity. Mouth:  No deformity or lesions, oral mucosa pink.  Throat/Neck:  Supple, without mass or thyromegaly. Cardiovascular:  S1, S2 present without murmurs appreciated. Normal pulses noted. Extremities without clubbing or edema. Respiratory:  Clear to auscultation bilaterally. No wheezes, rales, or rhonchi. No distress.  Gastrointestinal:  +BS, soft, non-tender and non-distended. No HSM noted. No guarding or rebound. No masses appreciated.  Rectal:  Deferred  Musculoskalatal:  Symmetrical without gross deformities. Normal posture. Skin:  Intact without significant lesions  or rashes. Neurologic:  Alert and oriented x4;  grossly normal neurologically. Psych:  Alert and cooperative. Normal mood and affect. Heme/Lymph/Immune: No significant cervical adenopathy. No excessive bruising noted.    08/29/2018 9:59 AM   Disclaimer: This note was dictated with voice recognition software. Similar sounding words can inadvertently be transcribed and may not be corrected upon review.

## 2019-03-12 ENCOUNTER — Other Ambulatory Visit: Payer: BC Managed Care – PPO

## 2019-03-12 ENCOUNTER — Other Ambulatory Visit: Payer: Self-pay

## 2019-03-12 DIAGNOSIS — Z20822 Contact with and (suspected) exposure to covid-19: Secondary | ICD-10-CM

## 2019-03-16 LAB — NOVEL CORONAVIRUS, NAA: SARS-CoV-2, NAA: NOT DETECTED

## 2019-05-11 IMAGING — CT CT ABD-PELV W/ CM
2 of 5 series · 17 of 46 positions shown, 19 images · IV contrast (Isovue)
Comparison: None.

CLINICAL DATA: Chronic constipation, epigastric pain

EXAM:
CT ABDOMEN AND PELVIS WITH CONTRAST
TECHNIQUE: Multidetector CT imaging of the abdomen and pelvis was performed
using the standard protocol following bolus administration of
intravenous contrast.
CONTRAST:  100mL KCM3M6-C00 IOPAMIDOL (KCM3M6-C00) INJECTION 61%

[Series 2: axial st · axial · 0.86mm/px · z∈[-535,-65]mm · 14 of 108 slices shown, 16 images]
[im 7/108  soft-tissue]
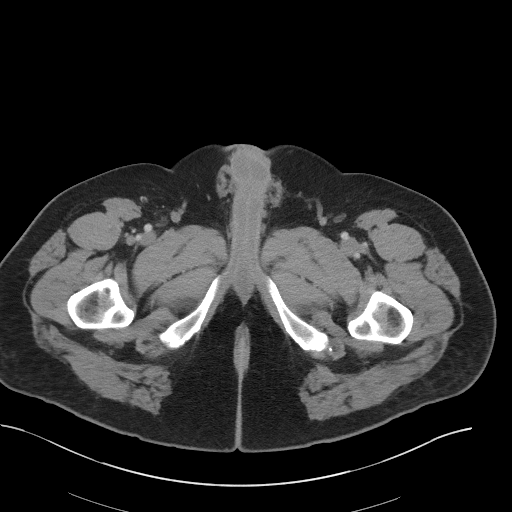
[im 7/108  bone]
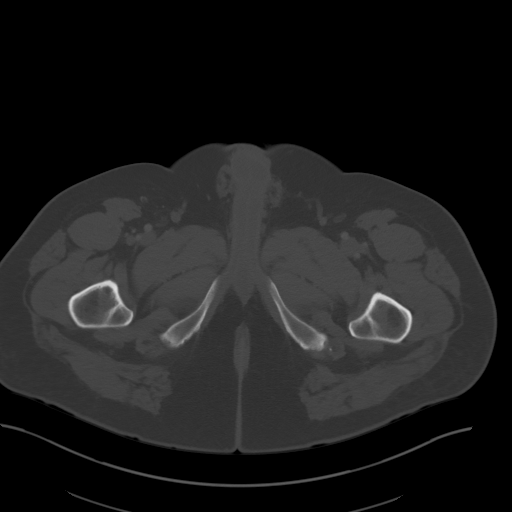
[im 13/108  soft-tissue]
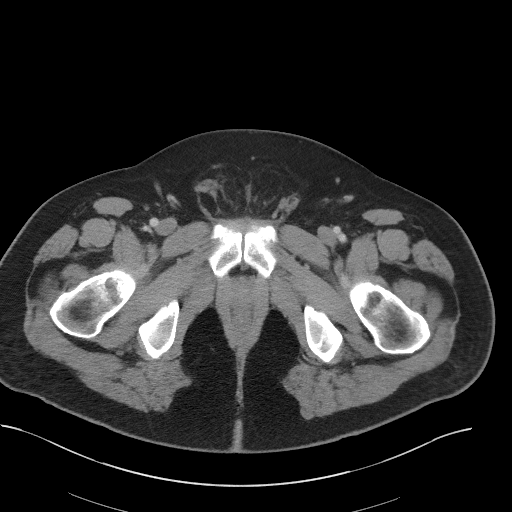
[im 19/108  soft-tissue]
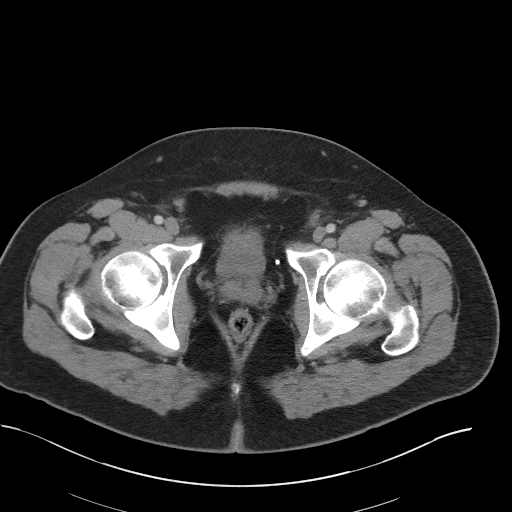
[im 32/108  soft-tissue]
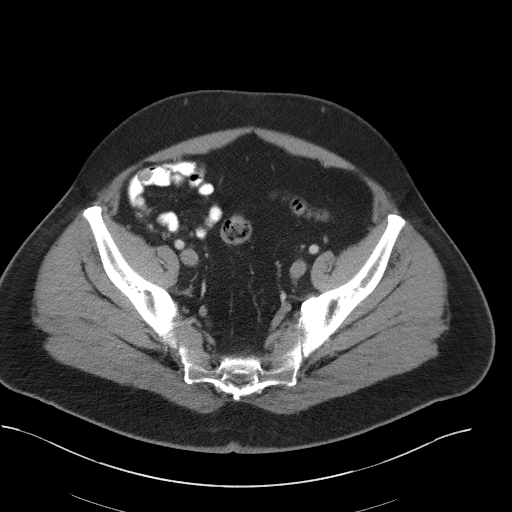
[im 38/108  soft-tissue]
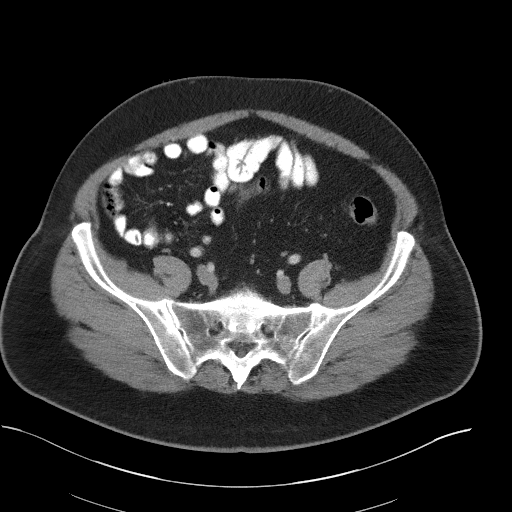
[im 45/108  soft-tissue]
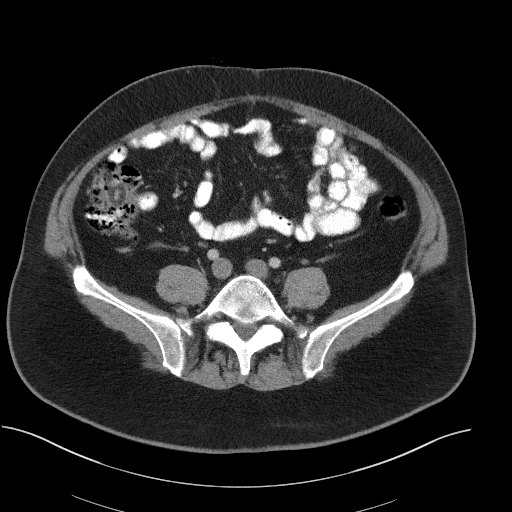
[im 51/108  soft-tissue]
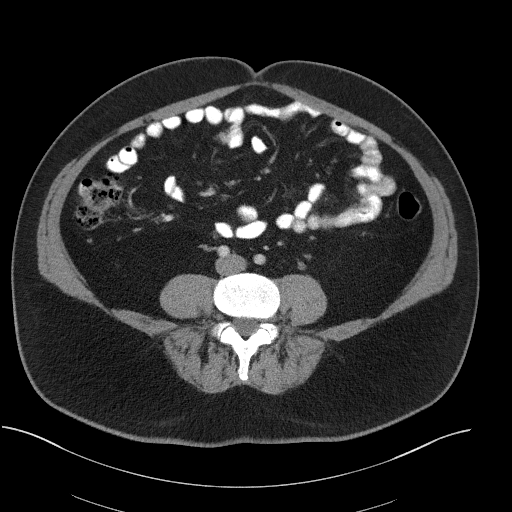
[im 57/108  soft-tissue]
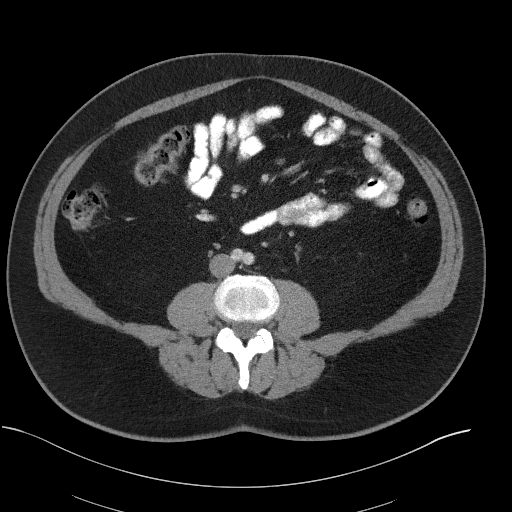
[im 63/108  soft-tissue]
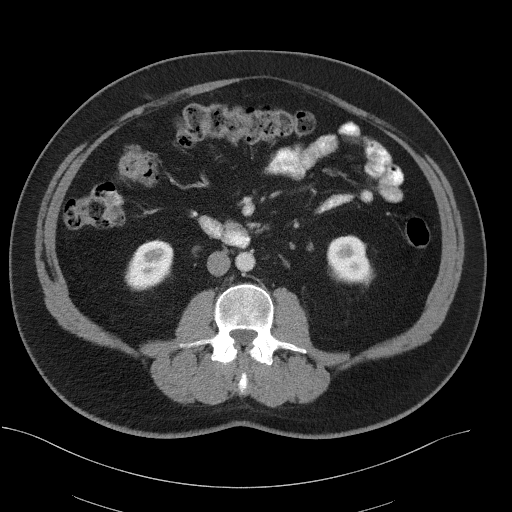
[im 63/108  bone]
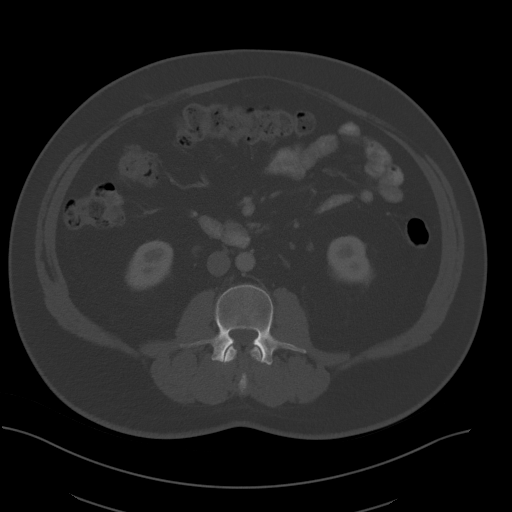
[im 70/108  soft-tissue]
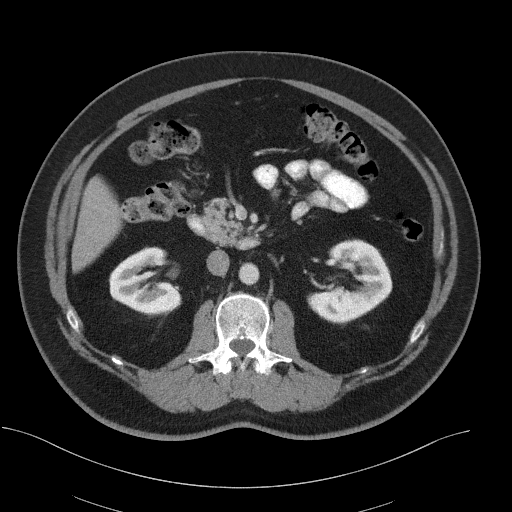
[im 82/108  soft-tissue]
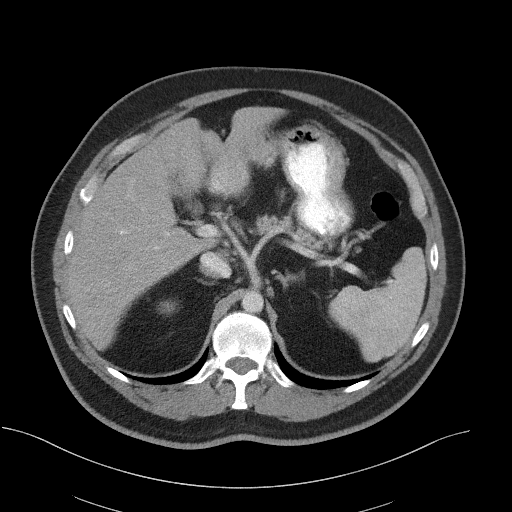
[im 89/108  soft-tissue]
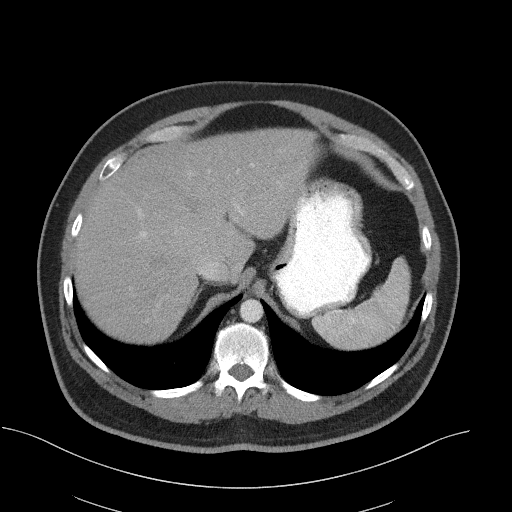
[im 95/108  soft-tissue]
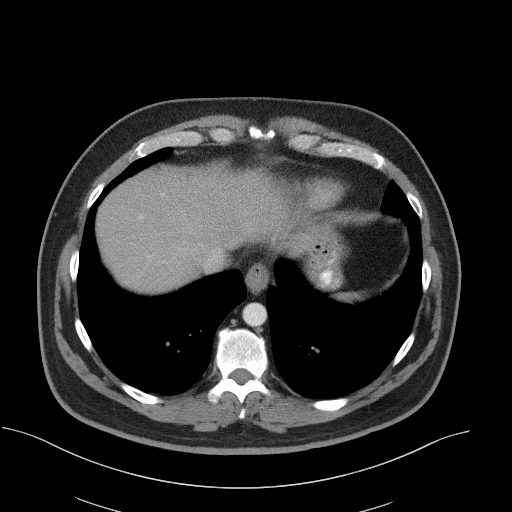
[im 101/108  soft-tissue]
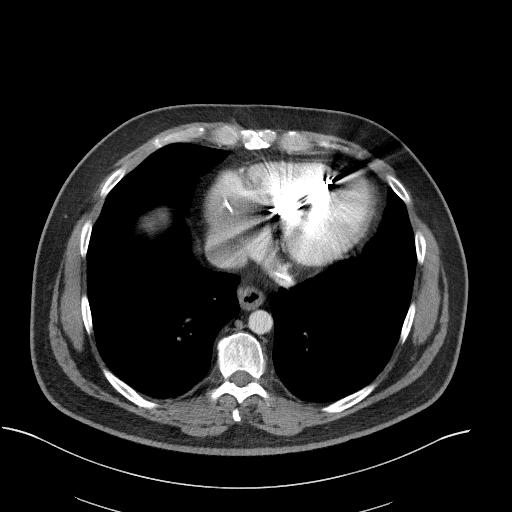

[Series 6: coronal st · coronal · 0.94mm/px · 3 of 116 slices shown]
[im 39/116  soft-tissue]
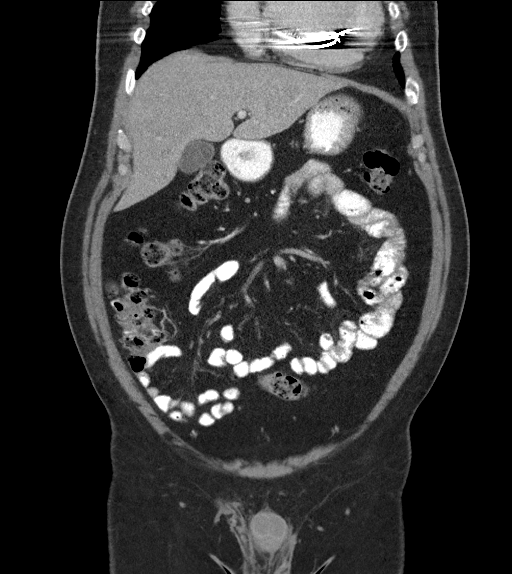
[im 52/116  soft-tissue]
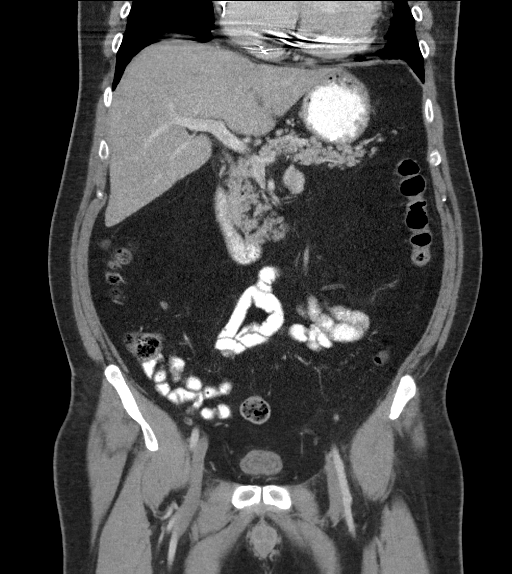
[im 64/116  soft-tissue]
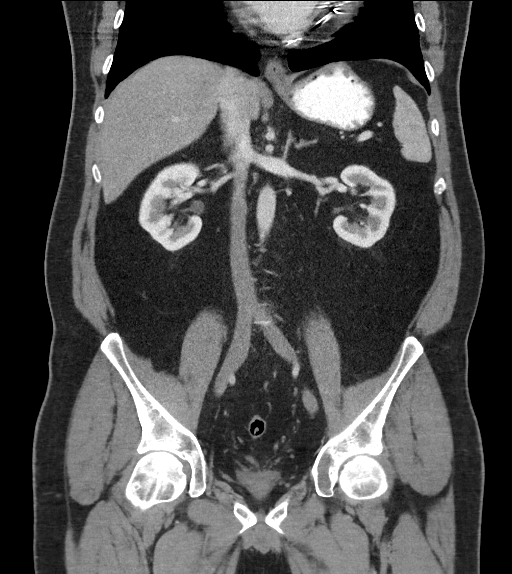

[17 of 46 positions shown; findings below may reference images not displayed]

FINDINGS: Lower chest: Cardiomegaly. Pacer wires noted in the right heart. No
acute abnormality.

Hepatobiliary: No focal hepatic abnormality. Gallbladder
unremarkable.

Pancreas: No focal abnormality or ductal dilatation.

Spleen: No focal abnormality.  Normal size.

Adrenals/Urinary Tract: No adrenal abnormality. No focal renal
abnormality. No stones or hydronephrosis. Urinary bladder is
unremarkable.

Stomach/Bowel: Appendix is normal. Stomach, large and small bowel
grossly unremarkable.

Vascular/Lymphatic: No evidence of aneurysm or adenopathy.

Reproductive: No visible focal abnormality.

Other: No free fluid or free air. Small right inguinal hernia
containing fat.

Musculoskeletal: No acute bony abnormality.
IMPRESSION: No acute findings in the abdomen or pelvis.

Cardiomegaly. Pacer wires noted in the right heart.

## 2019-05-29 ENCOUNTER — Other Ambulatory Visit: Payer: Self-pay

## 2019-05-30 ENCOUNTER — Other Ambulatory Visit: Payer: BC Managed Care – PPO

## 2019-06-06 ENCOUNTER — Other Ambulatory Visit: Payer: BC Managed Care – PPO

## 2020-06-06 ENCOUNTER — Other Ambulatory Visit: Payer: Self-pay

## 2020-06-06 ENCOUNTER — Other Ambulatory Visit: Payer: BC Managed Care – PPO

## 2020-06-06 DIAGNOSIS — Z20822 Contact with and (suspected) exposure to covid-19: Secondary | ICD-10-CM

## 2020-06-08 LAB — SARS-COV-2, NAA 2 DAY TAT

## 2020-06-08 LAB — NOVEL CORONAVIRUS, NAA: SARS-CoV-2, NAA: NOT DETECTED

## 2021-02-01 ENCOUNTER — Other Ambulatory Visit: Payer: Self-pay | Admitting: Gastroenterology

## 2021-04-10 NOTE — Progress Notes (Signed)
Attempted to obtain medical history via telephone, unable to reach at this time. I left a voicemail to return pre surgical testing department's phone call.  

## 2021-04-12 ENCOUNTER — Other Ambulatory Visit: Payer: Self-pay | Admitting: Gastroenterology

## 2021-04-14 ENCOUNTER — Encounter (HOSPITAL_COMMUNITY): Payer: Self-pay | Admitting: Gastroenterology

## 2021-04-14 ENCOUNTER — Other Ambulatory Visit: Payer: Self-pay

## 2021-04-18 ENCOUNTER — Ambulatory Visit (HOSPITAL_COMMUNITY): Payer: BC Managed Care – PPO | Admitting: Registered Nurse

## 2021-04-18 ENCOUNTER — Encounter (HOSPITAL_COMMUNITY)
Admission: RE | Disposition: A | Discharge status: Home / Self Care | Payer: Self-pay | Source: Home / Self Care | Attending: Gastroenterology

## 2021-04-18 ENCOUNTER — Encounter (HOSPITAL_COMMUNITY): Payer: Self-pay | Admitting: Gastroenterology

## 2021-04-18 ENCOUNTER — Other Ambulatory Visit: Payer: Self-pay

## 2021-04-18 ENCOUNTER — Ambulatory Visit (HOSPITAL_COMMUNITY)
Admission: RE | Admit: 2021-04-18 | Discharge: 2021-04-18 | Disposition: A | Discharge status: Home / Self Care | Payer: BC Managed Care – PPO | Attending: Gastroenterology | Admitting: Gastroenterology

## 2021-04-18 DIAGNOSIS — K222 Esophageal obstruction: Secondary | ICD-10-CM | POA: Insufficient documentation

## 2021-04-18 DIAGNOSIS — Z1211 Encounter for screening for malignant neoplasm of colon: Secondary | ICD-10-CM | POA: Diagnosis present

## 2021-04-18 DIAGNOSIS — I429 Cardiomyopathy, unspecified: Secondary | ICD-10-CM | POA: Diagnosis not present

## 2021-04-18 DIAGNOSIS — K319 Disease of stomach and duodenum, unspecified: Secondary | ICD-10-CM | POA: Diagnosis not present

## 2021-04-18 DIAGNOSIS — Z885 Allergy status to narcotic agent status: Secondary | ICD-10-CM | POA: Insufficient documentation

## 2021-04-18 DIAGNOSIS — K640 First degree hemorrhoids: Secondary | ICD-10-CM | POA: Insufficient documentation

## 2021-04-18 DIAGNOSIS — G4730 Sleep apnea, unspecified: Secondary | ICD-10-CM | POA: Insufficient documentation

## 2021-04-18 DIAGNOSIS — R12 Heartburn: Secondary | ICD-10-CM | POA: Diagnosis not present

## 2021-04-18 DIAGNOSIS — K2100 Gastro-esophageal reflux disease with esophagitis, without bleeding: Secondary | ICD-10-CM | POA: Diagnosis not present

## 2021-04-18 DIAGNOSIS — I4891 Unspecified atrial fibrillation: Secondary | ICD-10-CM | POA: Diagnosis not present

## 2021-04-18 DIAGNOSIS — K2900 Acute gastritis without bleeding: Secondary | ICD-10-CM | POA: Insufficient documentation

## 2021-04-18 DIAGNOSIS — Z95810 Presence of automatic (implantable) cardiac defibrillator: Secondary | ICD-10-CM | POA: Insufficient documentation

## 2021-04-18 DIAGNOSIS — R1310 Dysphagia, unspecified: Secondary | ICD-10-CM | POA: Diagnosis not present

## 2021-04-18 HISTORY — DX: Presence of automatic (implantable) cardiac defibrillator: Z95.810

## 2021-04-18 HISTORY — PX: BIOPSY: SHX5522

## 2021-04-18 HISTORY — DX: Presence of spectacles and contact lenses: Z97.3

## 2021-04-18 HISTORY — PX: ESOPHAGOGASTRODUODENOSCOPY (EGD) WITH PROPOFOL: SHX5813

## 2021-04-18 HISTORY — PX: COLONOSCOPY WITH PROPOFOL: SHX5780

## 2021-04-18 HISTORY — PX: BALLOON DILATION: SHX5330

## 2021-04-18 SURGERY — COLONOSCOPY WITH PROPOFOL
Anesthesia: Monitor Anesthesia Care

## 2021-04-18 MED ORDER — PROPOFOL 1000 MG/100ML IV EMUL
INTRAVENOUS | Status: AC
  Filled 2021-04-18: qty 100

## 2021-04-18 MED ORDER — SODIUM CHLORIDE 0.9 % IV SOLN: INTRAVENOUS | Status: DC

## 2021-04-18 MED ORDER — PROPOFOL 500 MG/50ML IV EMUL
INTRAVENOUS | Status: DC | PRN
  Administered 2021-04-18: 130 ug/kg/min via INTRAVENOUS

## 2021-04-18 MED ORDER — PROPOFOL 10 MG/ML IV BOLUS
INTRAVENOUS | Status: DC | PRN
  Administered 2021-04-18: 20 mg via INTRAVENOUS
  Administered 2021-04-18: 10 mg via INTRAVENOUS

## 2021-04-18 MED ORDER — LACTATED RINGERS IV SOLN: INTRAVENOUS | Status: DC

## 2021-04-18 SURGICAL SUPPLY — 25 items

## 2021-04-18 NOTE — H&P (Addendum)
Date of Initial H&P: 04/12/21  History reviewed, patient examined, no change in status, stable for surgery.

## 2021-04-18 NOTE — Op Note (Addendum)
[facility] Patient Name: Casey Holland Procedure Date: 04/18/2021 MRN: [id number] Attending MD: Shirley Friar , MD Date of Birth: August 23, 1971 CSN: [id number] Age: 50 Admit Type: Outpatient Procedure:                Colonoscopy Indications:              Screening for colorectal malignant neoplasm, This                            is the patient's first colonoscopy Providers:                Shirley Friar, MD, Estella Husk RN, RN,                            Melany Guernsey, Technician Referring MD:             Catalina Pizza, MD Medicines:                Propofol per Anesthesia Complications:            No immediate complications. Estimated Blood Loss:     Estimated blood loss: none. Procedure:                Pre-Anesthesia Assessment:                           - Prior to the procedure, a History and Physical                            was performed, and patient medications and                            allergies were reviewed. The patient's tolerance of                            previous anesthesia was also reviewed. The risks                            and benefits of the procedure and the sedation                            options and risks were discussed with the patient.                            All questions were answered, and informed consent                            was obtained. Prior Anticoagulants: The patient has                            taken no previous anticoagulant or antiplatelet                            agents. ASA Grade Assessment: III - A patient with  severe systemic disease. After reviewing the risks                            and benefits, the patient was deemed in                            satisfactory condition to undergo the procedure.                           After obtaining informed consent, the colonoscope                            was passed under direct vision. Throughout the                             procedure, the patient's blood pressure, pulse, and                            oxygen saturations were monitored continuously. The                            [id number]-HQ190L ([id number]) Olympus colonoscope was                            introduced through the anus and advanced to the the                            cecum, identified by appendiceal orifice and                            ileocecal valve. The colonoscopy was performed                            without difficulty. The patient tolerated the                            procedure well. The quality of the bowel                            preparation was adequate and good. The ileocecal                            valve, appendiceal orifice, and rectum were                            photographed. Scope In: 10:25:26 AM Scope Out: 10:34:24 AM Scope Withdrawal Time: 0 hours 5 minutes 43 seconds  Total Procedure Duration: 0 hours 8 minutes 58 seconds  Findings:      The perianal and digital rectal examinations were normal.      Internal hemorrhoids were found during retroflexion. The hemorrhoids       were small and Grade I (internal hemorrhoids that do not prolapse).      The exam was otherwise normal throughout the examined colon. Impression:               -  Internal hemorrhoids.                           - No specimens collected. Moderate Sedation:      Not Applicable - Patient had care per Anesthesia. Recommendation:           - Patient has a contact number available for                            emergencies. The signs and symptoms of potential                            delayed complications were discussed with the                            patient. Return to normal activities tomorrow.                            Written discharge instructions were provided to the                            patient.                           - High fiber diet.                           - Repeat colonoscopy in 10 years for screening                             purposes.                           - Continue present medications. Procedure Code(s):        --- Professional ---                           [phone], Colonoscopy, flexible; diagnostic, including                            collection of specimen(s) by brushing or washing,                            when performed (separate procedure) Diagnosis Code(s):        --- Professional ---                           Z12.11, Encounter for screening for malignant                            neoplasm of colon                           K64.0, First degree hemorrhoids CPT copyright 2019 [employer]. All rights reserved. The codes documented in this report are preliminary and upon coder review may  be revised to meet current compliance requirements. Shirley Friar, MD 04/18/2021 10:46:21 AM  This report has been signed electronically. Number of Addenda: 0

## 2021-04-18 NOTE — Anesthesia Postprocedure Evaluation (Signed)
Anesthesia Post Note  Patient: Casey Holland  Procedure(s) Performed: COLONOSCOPY WITH PROPOFOL ESOPHAGOGASTRODUODENOSCOPY (EGD) WITH PROPOFOL BALLOON DILATION BIOPSY     Patient location during evaluation: Endoscopy Anesthesia Type: MAC Level of consciousness: awake and alert Pain management: pain level controlled Vital Signs Assessment: post-procedure vital signs reviewed and stable Respiratory status: spontaneous breathing, nonlabored ventilation and respiratory function stable Cardiovascular status: stable and blood pressure returned to baseline Postop Assessment: no apparent nausea or vomiting Anesthetic complications: no   No notable events documented.  Last Vitals:  Vitals:   04/18/21 1050 04/18/21 1100  BP: 121/79 125/84  Pulse: 70 70  Resp: 13 11  Temp:    SpO2: 95% 99%    Last Pain:  Vitals:   04/18/21 1100  TempSrc:   PainSc: 0-No pain                 Taeko Schaffer,W. EDMOND

## 2021-04-18 NOTE — Transfer of Care (Signed)
Immediate Anesthesia Transfer of Care Note  Patient: SHAHEIM MAHAR  Procedure(s) Performed: COLONOSCOPY WITH PROPOFOL ESOPHAGOGASTRODUODENOSCOPY (EGD) WITH PROPOFOL BALLOON DILATION BIOPSY  Patient Location: PACU and Endoscopy Unit  Anesthesia Type:MAC  Level of Consciousness: awake, alert , oriented and patient cooperative  Airway & Oxygen Therapy: Patient Spontanous Breathing and Patient connected to face mask oxygen  Post-op Assessment: Report given to RN, Post -op Vital signs reviewed and stable and Patient moving all extremities  Post vital signs: Reviewed and stable  Last Vitals:  Vitals Value Taken Time  BP 131/97 04/18/21 1046  Temp    Pulse 73 04/18/21 1047  Resp 23 04/18/21 1047  SpO2 98 % 04/18/21 1047  Vitals shown include unvalidated device data.  Last Pain:  Vitals:   04/18/21 0931  TempSrc: Oral  PainSc: 0-No pain         Complications: No notable events documented.

## 2021-04-18 NOTE — Interval H&P Note (Signed)
History and Physical Interval Note:  04/18/2021 9:58 AM  Casey Holland  has presented today for surgery, with the diagnosis of Screening/GERD/Dysphagia.  The various methods of treatment have been discussed with the patient and family. After consideration of risks, benefits and other options for treatment, the patient has consented to  Procedure(s): COLONOSCOPY WITH PROPOFOL (N/A) ESOPHAGOGASTRODUODENOSCOPY (EGD) WITH PROPOFOL (N/A) SAVORY DILATION vs balloon (N/A) as a surgical intervention.  The patient's history has been reviewed, patient examined, no change in status, stable for surgery.  I have reviewed the patient's chart and labs.  Questions were answered to the patient's satisfaction.     Shirley Friar

## 2021-04-18 NOTE — Anesthesia Preprocedure Evaluation (Addendum)
Anesthesia Evaluation  Patient identified by MRN, date of birth, ID band Patient awake    Reviewed: Allergy & Precautions, H&P , NPO status , Patient's Chart, lab work & pertinent test results  Airway Mallampati: IV  TM Distance: >3 FB Neck ROM: Full  Mouth opening: Limited Mouth Opening  Dental no notable dental hx. (+) Teeth Intact, Dental Advisory Given   Pulmonary asthma , sleep apnea and Continuous Positive Airway Pressure Ventilation ,    Pulmonary exam normal breath sounds clear to auscultation       Cardiovascular negative cardio ROS   Rhythm:Regular Rate:Normal     Neuro/Psych Depression negative neurological ROS     GI/Hepatic Neg liver ROS, GERD  ,  Endo/Other  negative endocrine ROS  Renal/GU Renal disease  negative genitourinary   Musculoskeletal   Abdominal   Peds  Hematology negative hematology ROS (+)   Anesthesia Other Findings   Reproductive/Obstetrics negative OB ROS                            Anesthesia Physical Anesthesia Plan  ASA: 3  Anesthesia Plan: MAC   Post-op Pain Management:    Induction: Intravenous  PONV Risk Score and Plan: 1 and Propofol infusion  Airway Management Planned: Simple Face Mask  Additional Equipment:   Intra-op Plan:   Post-operative Plan:   Informed Consent: I have reviewed the patients History and Physical, chart, labs and discussed the procedure including the risks, benefits and alternatives for the proposed anesthesia with the patient or authorized representative who has indicated his/her understanding and acceptance.     Dental advisory given  Plan Discussed with: CRNA  Anesthesia Plan Comments:         Anesthesia Quick Evaluation

## 2021-04-18 NOTE — Discharge Instructions (Signed)

## 2021-04-18 NOTE — Op Note (Addendum)
[facility] Patient Name: Casey Holland Procedure Date: 04/18/2021 MRN: [id number] Attending MD: Shirley Friar , MD Date of Birth: [birth year] CSN: [id number] Age: 50 Admit Type: Outpatient Procedure:                Upper GI endoscopy Indications:              Dysphagia, Heartburn, Esophageal reflux Providers:                Shirley Friar, MD, Estella Husk RN, RN,                            Melany Guernsey, Technician Referring MD:             Catalina Pizza, MD Medicines:                Propofol per Anesthesia, Monitored Anesthesia Care Complications:            No immediate complications. Estimated Blood Loss:     Estimated blood loss was minimal. Procedure:                Pre-Anesthesia Assessment:                           - Prior to the procedure, a History and Physical                            was performed, and patient medications and                            allergies were reviewed. The patient's tolerance of                            previous anesthesia was also reviewed. The risks                            and benefits of the procedure and the sedation                            options and risks were discussed with the patient.                            All questions were answered, and informed consent                            was obtained. Prior Anticoagulants: The patient has                            taken no previous anticoagulant or antiplatelet                            agents. ASA Grade Assessment: III - A patient with                            severe systemic disease. After reviewing the risks  and benefits, the patient was deemed in                            satisfactory condition to undergo the procedure.                           After obtaining informed consent, the endoscope was                            passed under direct vision. Throughout the                            procedure, the patient's  blood pressure, pulse, and                            oxygen saturations were monitored continuously. The                            [id number]-[id number] ([id number]) Olympus endoscope was introduced                            through the mouth, and advanced to the second part                            of duodenum. The upper GI endoscopy was                            accomplished without difficulty. The patient                            tolerated the procedure well. Scope In: Scope Out: Findings:      A low-grade of narrowing Schatzki ring was found at the gastroesophageal       junction. A TTS dilator was passed through the scope, but the lesion       could not be dilated. Dilation with an 18-19-20 mm balloon dilator was       attempted to 20 mm. The dilation site was examined and showed no change.       Estimated blood loss: none.      The Z-line was found 42 cm from the incisors.      Segmental minimal inflammation characterized by congestion (edema) was       found in the gastric antrum. Biopsies were taken with a cold forceps for       histology. Estimated blood loss was minimal.      The cardia and gastric fundus were normal on retroflexion.      The examined duodenum was normal. Impression:               - Low-grade of narrowing Schatzki ring. Unable to                            dilate.                           - Z-line, 42 cm from the incisors.                           -  Acute gastritis. Biopsied.                           - Normal examined duodenum. Moderate Sedation:      Not Applicable - Patient had care per Anesthesia. Recommendation:           - Patient has a contact number available for                            emergencies. The signs and symptoms of potential                            delayed complications were discussed with the                            patient. Return to normal activities tomorrow.                            Written discharge instructions were provided to  the                            patient.                           - Resume previous diet.                           - Await pathology results. Procedure Code(s):        --- Professional ---                           [id number], Esophagogastroduodenoscopy, flexible,                            transoral; with transendoscopic balloon dilation of                            esophagus (less than 30 mm diameter) Diagnosis Code(s):        --- Professional ---                           K21.9, Gastro-esophageal reflux disease without                            esophagitis                           R13.10, Dysphagia, unspecified                           K22.2, Esophageal obstruction                           K29.00, Acute gastritis without bleeding                           R12, Heartburn CPT copyright 2019 [employer] Association. All rights reserved. The codes documented in this report  are preliminary and upon coder review may  be revised to meet current compliance requirements. Shirley Friar, MD 04/18/2021 10:43:37 AM This report has been signed electronically. Number of Addenda: 0

## 2021-04-19 ENCOUNTER — Encounter (HOSPITAL_COMMUNITY): Payer: Self-pay | Admitting: Gastroenterology

## 2021-04-19 LAB — SURGICAL PATHOLOGY

## 2022-03-20 ENCOUNTER — Other Ambulatory Visit (HOSPITAL_COMMUNITY): Payer: Self-pay | Admitting: Internal Medicine

## 2022-03-20 DIAGNOSIS — I422 Other hypertrophic cardiomyopathy: Secondary | ICD-10-CM

## 2022-03-30 ENCOUNTER — Ambulatory Visit (HOSPITAL_COMMUNITY)
Admission: RE | Admit: 2022-03-30 | Discharge: 2022-03-30 | Disposition: A | Discharge status: Home / Self Care | Payer: BC Managed Care – PPO | Source: Ambulatory Visit | Attending: Internal Medicine | Admitting: Internal Medicine

## 2022-03-30 DIAGNOSIS — I422 Other hypertrophic cardiomyopathy: Secondary | ICD-10-CM

## 2022-03-30 LAB — ECHOCARDIOGRAM COMPLETE
Area-P 1/2: 3.17 cm2
S' Lateral: 2.9 cm

## 2022-03-30 NOTE — Progress Notes (Signed)
*  PRELIMINARY RESULTS* Echocardiogram 2D Echocardiogram has been performed.  Stacey Drain 03/30/2022, 2:40 PM

## 2023-07-29 ENCOUNTER — Other Ambulatory Visit (HOSPITAL_COMMUNITY): Payer: Self-pay | Admitting: Internal Medicine

## 2023-07-29 DIAGNOSIS — R109 Unspecified abdominal pain: Secondary | ICD-10-CM

## 2023-08-16 ENCOUNTER — Ambulatory Visit (HOSPITAL_COMMUNITY)
Admission: RE | Admit: 2023-08-16 | Discharge: 2023-08-16 | Disposition: A | Discharge status: Home / Self Care | Payer: BC Managed Care – PPO | Source: Ambulatory Visit | Attending: Internal Medicine | Admitting: Internal Medicine

## 2023-08-16 DIAGNOSIS — R109 Unspecified abdominal pain: Secondary | ICD-10-CM | POA: Diagnosis present

## 2023-08-16 MED ORDER — IOHEXOL 300 MG/ML  SOLN
100.0000 mL | Freq: Once | INTRAMUSCULAR | Status: AC | PRN
  Administered 2023-08-16: 100 mL via INTRAVENOUS

## 2024-07-23 ENCOUNTER — Other Ambulatory Visit (HOSPITAL_COMMUNITY): Payer: Self-pay

## 2024-07-23 DIAGNOSIS — Z941 Heart transplant status: Secondary | ICD-10-CM

## 2024-08-03 ENCOUNTER — Encounter (HOSPITAL_COMMUNITY)
Admission: RE | Admit: 2024-08-03 | Discharge: 2024-08-03 | Disposition: A | Discharge status: Home / Self Care | Source: Ambulatory Visit | Attending: Surgery | Admitting: Surgery

## 2024-08-03 ENCOUNTER — Encounter (HOSPITAL_COMMUNITY)

## 2024-08-03 VITALS — BP 110/60 | HR 114 | Ht 73.0 in | Wt 252.7 lb

## 2024-08-03 DIAGNOSIS — Z941 Heart transplant status: Secondary | ICD-10-CM | POA: Insufficient documentation

## 2024-08-03 LAB — GLUCOSE, CAPILLARY: Glucose-Capillary: 279 mg/dL — ABNORMAL HIGH (ref 70–99)

## 2024-08-03 NOTE — Progress Notes (Signed)
 Cardiac Individual Treatment Plan  Patient Details  Name: Casey Holland MRN: [id number] Date of Birth: Jun 29, 1971 Referring Provider:   Flowsheet Row CARDIAC REHAB PHASE II ORIENTATION from 08/03/2024 in [facility] CARDIAC REHABILITATION  Referring Provider Arnie Cadet MD  [Primary Cardiologist: Dr. Nanci Patel]    Initial Encounter Date:  Flowsheet Row CARDIAC REHAB PHASE II ORIENTATION from 08/03/2024 in [place] CARDIAC REHABILITATION  Date 08/03/24    Visit Diagnosis: Status post heart transplant [facility])  Patient's Home Medications on Admission:  Current Outpatient Medications:    acetaminophen  (TYLENOL ) 500 MG tablet, Take 1,000-1,500 mg by mouth every 6 (six) hours as needed for moderate pain or headache., Disp: , Rfl:    Cholecalciferol (DIALYVITE VITAMIN D 5000) 125 MCG (5000 UT) capsule, Take 5,000 Units by mouth daily., Disp: , Rfl:    Coenzyme Q10 (CO Q-10) 200 MG CAPS, Take 200 mg by mouth daily., Disp: , Rfl:    dapagliflozin propanediol (FARXIGA) 10 MG TABS tablet, Take by mouth daily., Disp: , Rfl:    diltiazem  (CARDIZEM  CD) 120 MG 24 hr capsule, Take 120 mg by mouth daily., Disp: , Rfl:    omeprazole (PRILOSEC OTC) 20 MG tablet, Take 20 mg by mouth daily as needed (acid reflux)., Disp: , Rfl:    PRESCRIPTION MEDICATION, Apply 2 Pump topically See admin instructions. Micronized testosterone 5% gel apply 2 pumps on each arm once daily, Disp: , Rfl:    rosuvastatin (CRESTOR) 10 MG tablet, Take 10 mg by mouth daily., Disp: , Rfl:   Past Medical History: Past Medical History:  Diagnosis Date   A-fib [facility])    s/p sucessful ablation   Asthma    Cardiomyopathy, hypertrophic nonobstructive (HCC)    subaortic valve gradient of with recent congestive heart failure and volume overload, now improved with diuretics   Heart failure 10/2015   Class III/IV pre 2010 surgery > postop has no CHF   ICD (implantable cardioverter-defibrillator) in place    Wears glasses      Tobacco Use: Social History   Tobacco Use  Smoking Status Never  Smokeless Tobacco Never    Labs: Review Flowsheet  More data may exist      Latest Ref Rng & Units 07/17/2013 09/01/2014 02/23/2015 09/23/2015 09/26/2015  Labs for ITP Cardiac and Pulmonary Rehab  Cholestrol 100 - 199 mg/dL [phone]  [phone]  822  821  -  LDL (calc) 0 - 99 mg/dL 882  882  888  889  -  HDL-C >39 mg/dL 35  31  34  34  -  Trlycerides 0 - 149 mg/dL [phone]  810  840  827  -  Hemoglobin A1c - - - - - 5.2      Exercise Target Goals: Exercise Program Goal: Individual exercise prescription set using results from initial 6 min walk test and THRR while considering  patient's activity barriers and safety.   Exercise Prescription Goal: Initial exercise prescription builds to 30-45 minutes a day of aerobic activity, 2-3 days per week.  Home exercise guidelines will be given to patient during program as part of exercise prescription that the participant will acknowledge.   Education: Aerobic Exercise: - Group verbal and visual presentation on the components of exercise prescription. Introduces F.I.T.T principle from ACSM for exercise prescriptions.  Reviews F.I.T.T. principles of aerobic exercise including progression. Written material provided at class time.   Education: Resistance Exercise: - Group verbal and visual presentation on the components of exercise prescription. Introduces F.I.T.T  principle from ACSM for exercise prescriptions  Reviews F.I.T.T. principles of resistance exercise including progression. Written material provided at class time.    Education: Exercise & Equipment Safety: - Individual verbal instruction and demonstration of equipment use and safety with use of the equipment.   Education: Exercise Physiology & General Exercise Guidelines: - Group verbal and written instruction with models to review the exercise physiology of the cardiovascular system and associated critical values. Provides  general exercise guidelines with specific guidelines to those with heart or lung disease. Written material provided at class time.   Education: Flexibility, Balance, Mind/Body Relaxation: - Group verbal and visual presentation with interactive activity on the components of exercise prescription. Introduces F.I.T.T principle from ACSM for exercise prescriptions. Reviews F.I.T.T. principles of flexibility and balance exercise training including progression. Also discusses the mind body connection.  Reviews various relaxation techniques to help reduce and manage stress (i.e. Deep breathing, progressive muscle relaxation, and visualization). Balance handout provided to take home. Written material provided at class time.   Activity Barriers & Risk Stratification:  Activity Barriers & Cardiac Risk Stratification - 08/03/24 1341       Activity Barriers & Cardiac Risk Stratification   Activity Barriers Shortness of Breath;Assistive Device;Balance Concerns;Deconditioning;Muscular Weakness   Uses rollator.   Cardiac Risk Stratification High          6 Minute Walk:  6 Minute Walk     Row Name 08/03/24 1529         6 Minute Walk   Phase Initial     Distance 700 feet     Walk Time 6 minutes     # of Rest Breaks 0     MPH 1.33     METS 3.17     RPE 14     Perceived Dyspnea  1     VO2 Peak 11.01     Symptoms Yes (comment)     Comments slightly SOB at end     Resting HR 115 bpm     Resting BP 110/70     Resting Oxygen Saturation  99 %     Exercise Oxygen Saturation  during 6 min walk 96 %     Max Ex. HR 137 bpm     Max Ex. BP 164/62     2 Minute Post BP 128/72        Oxygen Initial Assessment:   Oxygen Re-Evaluation:   Oxygen Discharge (Final Oxygen Re-Evaluation):   Initial Exercise Prescription:  Initial Exercise Prescription - 08/03/24 1500       Date of Initial Exercise RX and Referring Provider   Date 08/03/24    Referring Provider Arnie Cadet MD   Primary  Cardiologist: Dr. Nanci Blanch     Oxygen   Maintain Oxygen Saturation 88% or higher      Treadmill   MPH 1.3    Grade 1    Minutes 15    METs 2.17      REL-XR   Level 3    Speed 50    Minutes 15    METs 3      Prescription Details   Frequency (times per week) 3    Duration Progress to 30 minutes of continuous aerobic without signs/symptoms of physical distress      Intensity   THRR 40-80% of Max Heartrate 136-157    Ratings of Perceived Exertion 11-13    Perceived Dyspnea 0-4      Progression   Progression Continue to progress  workloads to maintain intensity without signs/symptoms of physical distress.      Resistance Training   Training Prescription Yes    Weight 4 lb    Reps 10-15          Perform Capillary Blood Glucose checks as needed.  Exercise Prescription Changes:   Exercise Prescription Changes     Row Name 08/03/24 1500             Response to Exercise   Blood Pressure (Admit) 110/70       Blood Pressure (Exercise) 164/62       Blood Pressure (Exit) 128/72       Heart Rate (Admit) 115 bpm       Heart Rate (Exercise) 137 bpm       Heart Rate (Exit) 128 bpm       Oxygen Saturation (Admit) 99 %       Oxygen Saturation (Exercise) 96 %       Rating of Perceived Exertion (Exercise) 14       Perceived Dyspnea (Exercise) 1       Symptoms slightly SOB       Comments walk test results          Exercise Comments:   Exercise Goals and Review:   Exercise Goals     Row Name 08/03/24 1533             Exercise Goals   Increase Physical Activity Yes       Intervention Provide advice, education, support and counseling about physical activity/exercise needs.;Develop an individualized exercise prescription for aerobic and resistive training based on initial evaluation findings, risk stratification, comorbidities and participant's personal goals.       Expected Outcomes Short Term: Attend rehab on a regular basis to increase amount of physical  activity.;Long Term: Exercising regularly at least 3-5 days a week.;Long Term: Add in home exercise to make exercise part of routine and to increase amount of physical activity.       Increase Strength and Stamina Yes       Intervention Provide advice, education, support and counseling about physical activity/exercise needs.;Develop an individualized exercise prescription for aerobic and resistive training based on initial evaluation findings, risk stratification, comorbidities and participant's personal goals.       Expected Outcomes Short Term: Increase workloads from initial exercise prescription for resistance, speed, and METs.;Short Term: Perform resistance training exercises routinely during rehab and add in resistance training at home;Long Term: Improve cardiorespiratory fitness, muscular endurance and strength as measured by increased METs and functional capacity ( )       Able to understand and use rate of perceived exertion (RPE) scale Yes       Intervention Provide education and explanation on how to use RPE scale       Expected Outcomes Short Term: Able to use RPE daily in rehab to express subjective intensity level;Long Term:  Able to use RPE to guide intensity level when exercising independently       Able to understand and use Dyspnea scale Yes       Intervention Provide education and explanation on how to use Dyspnea scale       Expected Outcomes Short Term: Able to use Dyspnea scale daily in rehab to express subjective sense of shortness of breath during exertion;Long Term: Able to use Dyspnea scale to guide intensity level when exercising independently       Knowledge and understanding of Target Heart Rate Range (THRR) Yes  Intervention Provide education and explanation of THRR including how the numbers were predicted and where they are located for reference       Expected Outcomes Short Term: Able to state/look up THRR;Long Term: Able to use THRR to govern intensity when  exercising independently;Short Term: Able to use daily as guideline for intensity in rehab       Able to check pulse independently Yes       Intervention Provide education and demonstration on how to check pulse in carotid and radial arteries.;Review the importance of being able to check your own pulse for safety during independent exercise       Expected Outcomes Short Term: Able to explain why pulse checking is important during independent exercise;Long Term: Able to check pulse independently and accurately       Understanding of Exercise Prescription Yes       Intervention Provide education, explanation, and written materials on patient's individual exercise prescription       Expected Outcomes Short Term: Able to explain program exercise prescription;Long Term: Able to explain home exercise prescription to exercise independently          Exercise Goals Re-Evaluation :   Discharge Exercise Prescription (Final Exercise Prescription Changes):  Exercise Prescription Changes - 08/03/24 1500       Response to Exercise   Blood Pressure (Admit) 110/70    Blood Pressure (Exercise) 164/62    Blood Pressure (Exit) 128/72    Heart Rate (Admit) 115 bpm    Heart Rate (Exercise) 137 bpm    Heart Rate (Exit) 128 bpm    Oxygen Saturation (Admit) 99 %    Oxygen Saturation (Exercise) 96 %    Rating of Perceived Exertion (Exercise) 14    Perceived Dyspnea (Exercise) 1    Symptoms slightly SOB    Comments walk test results          Nutrition:  Target Goals: Understanding of nutrition guidelines, daily intake of sodium 1500mg , cholesterol 200mg , calories 30% from fat and 7% or less from saturated fats, daily to have 5 or more servings of fruits and vegetables.  Education: Nutrition 1 -Group instruction provided by verbal, written material, interactive activities, discussions, models, and posters to present general guidelines for heart healthy nutrition including macronutrients, label reading,  and promoting whole foods over processed counterparts. Education serves as pensions consultant of discussion of heart healthy eating for all. Written material provided at class time.    Education: Nutrition 2 -Group instruction provided by verbal, written material, interactive activities, discussions, models, and posters to present general guidelines for heart healthy nutrition including sodium, cholesterol, and saturated fat. Providing guidance of habit forming to improve blood pressure, cholesterol, and body weight. Written material provided at class time.     Biometrics:    Nutrition Therapy Plan and Nutrition Goals:   Nutrition Assessments:  MEDIFICTS Score Key: >=70 Need to make dietary changes  40-70 Heart Healthy Diet <= 40 Therapeutic Level Cholesterol Diet   Picture Your Plate Scores: <59 Unhealthy dietary pattern with much room for improvement. 41-50 Dietary pattern unlikely to meet recommendations for good health and room for improvement. 51-60 More healthful dietary pattern, with some room for improvement.  >60 Healthy dietary pattern, although there may be some specific behaviors that could be improved.    Nutrition Goals Re-Evaluation:   Nutrition Goals Discharge (Final Nutrition Goals Re-Evaluation):   Psychosocial: Target Goals: Acknowledge presence or absence of significant depression and/or stress, maximize coping skills, provide positive support system.  Participant is able to verbalize types and ability to use techniques and skills needed for reducing stress and depression.   Education: Stress, Anxiety, and Depression - Group verbal and visual presentation to define topics covered.  Reviews how body is impacted by stress, anxiety, and depression.  Also discusses healthy ways to reduce stress and to treat/manage anxiety and depression. Written material provided at class time.   Education: Sleep Hygiene -Provides group verbal and written instruction about how  sleep can affect your health.  Define sleep hygiene, discuss sleep cycles and impact of sleep habits. Review good sleep hygiene tips.   Initial Review & Psychosocial Screening:  Initial Psych Review & Screening - 08/03/24 1428       Initial Review   Current issues with Current Psychotropic Meds;Current Anxiety/Panic;Current Sleep Concerns;Current Depression      Family Dynamics   Good Support System? Yes    Comments Patient's wife, mother, aunt and uncle support him.      Barriers   Psychosocial barriers to participate in program The patient should benefit from training in stress management and relaxation.;There are no identifiable barriers or psychosocial needs.      Screening Interventions   Interventions Encouraged to exercise;To provide support and resources with identified psychosocial needs;Provide feedback about the scores to participant    Expected Outcomes Short Term goal: Utilizing psychosocial counselor, staff and physician to assist with identification of specific Stressors or current issues interfering with healing process. Setting desired goal for each stressor or current issue identified.;Long Term Goal: Stressors or current issues are controlled or eliminated.;Short Term goal: Identification and review with participant of any Quality of Life or Depression concerns found by scoring the questionnaire.;Long Term goal: The participant improves quality of Life and PHQ9 Scores as seen by post scores and/or verbalization of changes          Quality of Life Scores:   Scores of 19 and below usually indicate a poorer quality of life in these areas.  A difference of  2-3 points is a clinically meaningful difference.  A difference of 2-3 points in the total score of the Quality of Life Index has been associated with significant improvement in overall quality of life, self-image, physical symptoms, and general health in studies assessing change in quality of life.  PHQ-9: Review  Flowsheet       08/03/2024  Depression screen PHQ 2/9  Decreased Interest 1  Down, Depressed, Hopeless 1  PHQ - 2 Score 2  Altered sleeping 1  Tired, decreased energy 1  Change in appetite 1  Feeling bad or failure about yourself  1  Trouble concentrating 1  Moving slowly or fidgety/restless 1  Suicidal thoughts 0  PHQ-9 Score 8  Difficult doing work/chores Very difficult   Interpretation of Total Score  Total Score Depression Severity:  1-4 = Minimal depression, 5-9 = Mild depression, 10-14 = Moderate depression, 15-19 = Moderately severe depression, 20-27 = Severe depression   Psychosocial Evaluation and Intervention:  Psychosocial Evaluation - 08/03/24 1441       Psychosocial Evaluation & Interventions   Interventions Stress management education;Relaxation education;Encouraged to exercise with the program and follow exercise prescription    Comments Patient was referred to cardiac rehab with heart transplant 10/25. He has had heart issues long term and did CR in 2015 after having a myectomy. He is an optician, dispensing at [employer]. He has not returned to work and is not sure he will due to being around children  and his suppressed immunity. He hopes to find something within the school system to maybe work from home or not be around kids so much. He is currently being treated for both depression and anxiety with Lexapro. He feels his anxiety has worsned since his surgery but it is improving. He says he has trouble falling asleep. His PCP is tying melotonin. He says he has not seen a big difference with the medication. His goals for the program are to get back to normal; improve his strength and stamina and be able to return to work. He has no barriers identified to complete the program.    Expected Outcomes Short Term: Patient will start the program and attend consistently. Long Term: Patient will complete the program meeting personal goals.    Continue Psychosocial Services   Follow up required by staff          Psychosocial Re-Evaluation:   Psychosocial Discharge (Final Psychosocial Re-Evaluation):   Vocational Rehabilitation: Provide vocational rehab assistance to qualifying candidates.   Vocational Rehab Evaluation & Intervention:  Vocational Rehab - 08/03/24 1355       Initial Vocational Rehab Evaluation & Intervention   Assessment shows need for Vocational Rehabilitation No      Vocational Rehab Re-Evaulation   Comments Patient plans to return to his job.          Education: Education Goals: Education classes will be provided on a variety of topics geared toward better understanding of heart health and risk factor modification. Participant will state understanding/return demonstration of topics presented as noted by education test scores.  Learning Barriers/Preferences:  Learning Barriers/Preferences - 08/03/24 1355       Learning Barriers/Preferences   Learning Barriers None    Learning Preferences Written Material;Skilled Demonstration;Computer/Internet          General Cardiac Education Topics:  AED/CPR: - Group verbal and written instruction with the use of models to demonstrate the basic use of the AED with the basic ABC's of resuscitation.   Test and Procedures: - Group verbal and visual presentation and models provide information about basic cardiac anatomy and function. Reviews the testing methods done to diagnose heart disease and the outcomes of the test results. Describes the treatment choices: Medical Management, Angioplasty, or Coronary Bypass Surgery for treating various heart conditions including Myocardial Infarction, Angina, Valve Disease, and Cardiac Arrhythmias. Written material provided at class time.   Medication Safety: - Group verbal and visual instruction to review commonly prescribed medications for heart and lung disease. Reviews the medication, class of the drug, and side effects. Includes the steps  to properly store meds and maintain the prescription regimen. Written material provided at class time.   Intimacy: - Group verbal instruction through game format to discuss how heart and lung disease can affect sexual intimacy. Written material provided at class time.   Know Your Numbers and Heart Failure: - Group verbal and visual instruction to discuss disease risk factors for cardiac and pulmonary disease and treatment options.  Reviews associated critical values for Overweight/Obesity, Hypertension, Cholesterol, and Diabetes.  Discusses basics of heart failure: signs/symptoms and treatments.  Introduces Heart Failure Zone chart for action plan for heart failure. Written material provided at class time.   Infection Prevention: - Provides verbal and written material to individual with discussion of infection control including proper hand washing and proper equipment cleaning during exercise session.   Falls Prevention: - Provides verbal and written material to individual with discussion of falls prevention and safety.  Other: -Provides group and verbal instruction on various topics (see comments)   Knowledge Questionnaire Score:   Core Components/Risk Factors/Patient Goals at Admission:  Personal Goals and Risk Factors at Admission - 08/03/24 1356       Core Components/Risk Factors/Patient Goals on Admission    Weight Management Weight Maintenance    Improve shortness of breath with ADL's Yes    Intervention Provide education, individualized exercise plan and daily activity instruction to help decrease symptoms of SOB with activities of daily living.    Expected Outcomes Short Term: Improve cardiorespiratory fitness to achieve a reduction of symptoms when performing ADLs;Long Term: Be able to perform more ADLs without symptoms or delay the onset of symptoms    Diabetes Yes    Intervention Provide education about signs/symptoms and action to take for hypo/hyperglycemia.;Provide  education about proper nutrition, including hydration, and aerobic/resistive exercise prescription along with prescribed medications to achieve blood glucose in normal ranges: Fasting glucose 65-99 mg/dL    Expected Outcomes Short Term: Participant verbalizes understanding of the signs/symptoms and immediate care of hyper/hypoglycemia, proper foot care and importance of medication, aerobic/resistive exercise and nutrition plan for blood glucose control.;Long Term: Attainment of HbA1C < 7%.    Hypertension Yes    Intervention Provide education on lifestyle modifcations including regular physical activity/exercise, weight management, moderate sodium restriction and increased consumption of fresh fruit, vegetables, and low fat dairy, alcohol moderation, and smoking cessation.;Monitor prescription use compliance.    Expected Outcomes Short Term: Continued assessment and intervention until BP is < 140/64mm HG in hypertensive participants. < 130/32mm HG in hypertensive participants with diabetes, heart failure or chronic kidney disease.;Long Term: Maintenance of blood pressure at goal levels.    Lipids Yes    Intervention Provide education and support for participant on nutrition & aerobic/resistive exercise along with prescribed medications to achieve LDL 70mg , HDL >40mg .    Expected Outcomes Short Term: Participant states understanding of desired cholesterol values and is compliant with medications prescribed. Participant is following exercise prescription and nutrition guidelines.;Long Term: Cholesterol controlled with medications as prescribed, with individualized exercise RX and with personalized nutrition plan. Value goals: LDL < 70mg , HDL > 40 mg.          Education:Diabetes - Individual verbal and written instruction to review signs/symptoms of diabetes, desired ranges of glucose level fasting, after meals and with exercise. Acknowledge that pre and post exercise glucose checks will be done for 3  sessions at entry of program.   Core Components/Risk Factors/Patient Goals Review:    Core Components/Risk Factors/Patient Goals at Discharge (Final Review):    ITP Comments:  ITP Comments     Row Name 08/03/24 1501           ITP Comments Patient arrived for 1st visit/orientation/education at 1300. Patient was referred to CR by Dr. Jordan Schroder due to Heart Transplant. During orientation advised patient on arrival and appointment times what to wear, what to do before, during and after exercise. Reviewed attendance and class policy.  Pt is scheduled to return Cardiac Rehab on 08/05/24 at 915. Pt was advised to come to class 15 minutes before class starts.  Discussed RPE/Dpysnea scales. Patient participated in warm up stretches. Patient was able to complete 6 minute walk test.  Telemetry:NSR. Patient was measured for the equipment. Discussed equipment safety with patient. Took patient pre-anthropometric measurements. Patient finished visit at 1445.          Comments: Patient arrived for 1st visit/orientation/education at 1300. Patient was  referred to CR by Dr. Jordan Schroder due to Heart Transplant. During orientation advised patient on arrival and appointment times what to wear, what to do before, during and after exercise. Reviewed attendance and class policy.  Pt is scheduled to return Cardiac Rehab on 08/05/24 at 915. Pt was advised to come to class 15 minutes before class starts.  Discussed RPE/Dpysnea scales. Patient participated in warm up stretches. Patient was able to complete 6 minute walk test.  Telemetry:NSR. Patient was measured for the equipment. Discussed equipment safety with patient. Took patient pre-anthropometric measurements. Patient finished visit at 1445.

## 2024-08-03 NOTE — Patient Instructions (Signed)
 Patient Instructions  Patient Details  Name: Casey Holland MRN: [id number] Date of Birth: 1971/02/13 Referring Provider:  Shona Norleen PEDLAR, MD  Below are your personal goals for exercise, nutrition, and risk factors. Our goal is to help you stay on track towards obtaining and maintaining these goals. We will be discussing your progress on these goals with you throughout the program.  Initial Exercise Prescription:  Initial Exercise Prescription - 08/03/24 1500       Date of Initial Exercise RX and Referring Provider   Date 08/03/24    Referring Provider Arnie Cadet MD   Primary Cardiologist: Dr. Nanci Blanch     Oxygen   Maintain Oxygen Saturation 88% or higher      Treadmill   MPH 1.3    Grade 1    Minutes 15    METs 2.17      REL-XR   Level 3    Speed 50    Minutes 15    METs 3      Prescription Details   Frequency (times per week) 3    Duration Progress to 30 minutes of continuous aerobic without signs/symptoms of physical distress      Intensity   THRR 40-80% of Max Heartrate 136-157    Ratings of Perceived Exertion 11-13    Perceived Dyspnea 0-4      Progression   Progression Continue to progress workloads to maintain intensity without signs/symptoms of physical distress.      Resistance Training   Training Prescription Yes    Weight 4 lb    Reps 10-15          Exercise Goals: Frequency: Be able to perform aerobic exercise two to three times per week in program working toward 2-5 days per week of home exercise.  Intensity: Work with a perceived exertion of 11 (fairly light) - 15 (hard) while following your exercise prescription.  We will make changes to your prescription with you as you progress through the program.   Duration: Be able to do 30 to 45 minutes of continuous aerobic exercise in addition to a 5 minute warm-up and a 5 minute cool-down routine.   Nutrition Goals: Your personal nutrition goals will be established when you do your nutrition  analysis with the dietician.  The following are general nutrition guidelines to follow: Cholesterol < 200mg /day Sodium < 1500mg /day Fiber: Men over 50 yrs - 30 grams per day  Personal Goals:  Personal Goals and Risk Factors at Admission - 08/03/24 1356       Core Components/Risk Factors/Patient Goals on Admission    Weight Management Weight Maintenance;Yes    Intervention Weight Management: Develop a combined nutrition and exercise program designed to reach desired caloric intake, while maintaining appropriate intake of nutrient and fiber, sodium and fats, and appropriate energy expenditure required for the weight goal.;Weight Management: Provide education and appropriate resources to help participant work on and attain dietary goals.    Admit Weight 252 lb 11.2 oz (114.6 kg)    Goal Weight: Short Term 250 lb (113.4 kg)    Goal Weight: Long Term 250 lb (113.4 kg)    Expected Outcomes Short Term: Continue to assess and modify interventions until short term weight is achieved;Long Term: Adherence to nutrition and physical activity/exercise program aimed toward attainment of established weight goal;Weight Maintenance: Understanding of the daily nutrition guidelines, which includes 25-35% calories from fat, 7% or less cal from saturated fats, less than 200mg  cholesterol, less than 1.5gm of sodium, &  5 or more servings of fruits and vegetables daily    Improve shortness of breath with ADL's Yes    Intervention Provide education, individualized exercise plan and daily activity instruction to help decrease symptoms of SOB with activities of daily living.    Expected Outcomes Short Term: Improve cardiorespiratory fitness to achieve a reduction of symptoms when performing ADLs;Long Term: Be able to perform more ADLs without symptoms or delay the onset of symptoms    Diabetes Yes    Intervention Provide education about signs/symptoms and action to take for hypo/hyperglycemia.;Provide education about  proper nutrition, including hydration, and aerobic/resistive exercise prescription along with prescribed medications to achieve blood glucose in normal ranges: Fasting glucose 65-99 mg/dL    Expected Outcomes Short Term: Participant verbalizes understanding of the signs/symptoms and immediate care of hyper/hypoglycemia, proper foot care and importance of medication, aerobic/resistive exercise and nutrition plan for blood glucose control.;Long Term: Attainment of HbA1C < 7%.    Hypertension Yes    Intervention Provide education on lifestyle modifcations including regular physical activity/exercise, weight management, moderate sodium restriction and increased consumption of fresh fruit, vegetables, and low fat dairy, alcohol moderation, and smoking cessation.;Monitor prescription use compliance.    Expected Outcomes Short Term: Continued assessment and intervention until BP is < 140/35mm HG in hypertensive participants. < 130/70mm HG in hypertensive participants with diabetes, heart failure or chronic kidney disease.;Long Term: Maintenance of blood pressure at goal levels.    Lipids Yes    Intervention Provide education and support for participant on nutrition & aerobic/resistive exercise along with prescribed medications to achieve LDL 70mg , HDL >40mg .    Expected Outcomes Short Term: Participant states understanding of desired cholesterol values and is compliant with medications prescribed. Participant is following exercise prescription and nutrition guidelines.;Long Term: Cholesterol controlled with medications as prescribed, with individualized exercise RX and with personalized nutrition plan. Value goals: LDL < 70mg , HDL > 40 mg.          Tobacco Use Initial Evaluation: Social History   Tobacco Use  Smoking Status Never  Smokeless Tobacco Never    Exercise Goals and Review:  Exercise Goals     Row Name 08/03/24 1533             Exercise Goals   Increase Physical Activity Yes        Intervention Provide advice, education, support and counseling about physical activity/exercise needs.;Develop an individualized exercise prescription for aerobic and resistive training based on initial evaluation findings, risk stratification, comorbidities and participant's personal goals.       Expected Outcomes Short Term: Attend rehab on a regular basis to increase amount of physical activity.;Long Term: Exercising regularly at least 3-5 days a week.;Long Term: Add in home exercise to make exercise part of routine and to increase amount of physical activity.       Increase Strength and Stamina Yes       Intervention Provide advice, education, support and counseling about physical activity/exercise needs.;Develop an individualized exercise prescription for aerobic and resistive training based on initial evaluation findings, risk stratification, comorbidities and participant's personal goals.       Expected Outcomes Short Term: Increase workloads from initial exercise prescription for resistance, speed, and METs.;Short Term: Perform resistance training exercises routinely during rehab and add in resistance training at home;Long Term: Improve cardiorespiratory fitness, muscular endurance and strength as measured by increased METs and functional capacity ( )       Able to understand and use rate of perceived exertion (RPE)  scale Yes       Intervention Provide education and explanation on how to use RPE scale       Expected Outcomes Short Term: Able to use RPE daily in rehab to express subjective intensity level;Long Term:  Able to use RPE to guide intensity level when exercising independently       Able to understand and use Dyspnea scale Yes       Intervention Provide education and explanation on how to use Dyspnea scale       Expected Outcomes Short Term: Able to use Dyspnea scale daily in rehab to express subjective sense of shortness of breath during exertion;Long Term: Able to use Dyspnea scale to  guide intensity level when exercising independently       Knowledge and understanding of Target Heart Rate Range (THRR) Yes       Intervention Provide education and explanation of THRR including how the numbers were predicted and where they are located for reference       Expected Outcomes Short Term: Able to state/look up THRR;Long Term: Able to use THRR to govern intensity when exercising independently;Short Term: Able to use daily as guideline for intensity in rehab       Able to check pulse independently Yes       Intervention Provide education and demonstration on how to check pulse in carotid and radial arteries.;Review the importance of being able to check your own pulse for safety during independent exercise       Expected Outcomes Short Term: Able to explain why pulse checking is important during independent exercise;Long Term: Able to check pulse independently and accurately       Understanding of Exercise Prescription Yes       Intervention Provide education, explanation, and written materials on patient's individual exercise prescription       Expected Outcomes Short Term: Able to explain program exercise prescription;Long Term: Able to explain home exercise prescription to exercise independently          Copy of goals given to participant.

## 2024-08-04 ENCOUNTER — Encounter (HOSPITAL_COMMUNITY): Payer: Self-pay | Admitting: *Deleted

## 2024-08-04 DIAGNOSIS — Z941 Heart transplant status: Secondary | ICD-10-CM

## 2024-08-04 NOTE — Progress Notes (Signed)
 Cardiac Individual Treatment Plan  Patient Details  Name: Casey Holland MRN: [id number] Date of Birth: [birth year] Referring Provider:   Flowsheet Row CARDIAC REHAB PHASE II ORIENTATION from 08/03/2024 in [facility] CARDIAC REHABILITATION  Referring Provider Arnie Cadet MD  [Primary Cardiologist: Dr. Nanci Patel]    Initial Encounter Date:  Flowsheet Row CARDIAC REHAB PHASE II ORIENTATION from 08/03/2024 in [facility] CARDIAC REHABILITATION  Date 08/03/24    Visit Diagnosis: Status post heart transplant [facility])  Patient's Home Medications on Admission:  Current Outpatient Medications:    acetaminophen  (TYLENOL ) 500 MG tablet, Take 1,000-1,500 mg by mouth every 6 (six) hours as needed for moderate pain or headache., Disp: , Rfl:    Cholecalciferol (DIALYVITE VITAMIN D 5000) 125 MCG (5000 UT) capsule, Take 5,000 Units by mouth daily., Disp: , Rfl:    Coenzyme Q10 (CO Q-10) 200 MG CAPS, Take 200 mg by mouth daily., Disp: , Rfl:    dapagliflozin propanediol (FARXIGA) 10 MG TABS tablet, Take by mouth daily., Disp: , Rfl:    diltiazem  (CARDIZEM  CD) 120 MG 24 hr capsule, Take 120 mg by mouth daily., Disp: , Rfl:    omeprazole (PRILOSEC OTC) 20 MG tablet, Take 20 mg by mouth daily as needed (acid reflux)., Disp: , Rfl:    PRESCRIPTION MEDICATION, Apply 2 Pump topically See admin instructions. Micronized testosterone 5% gel apply 2 pumps on each arm once daily, Disp: , Rfl:    rosuvastatin (CRESTOR) 10 MG tablet, Take 10 mg by mouth daily., Disp: , Rfl:   Past Medical History: Past Medical History:  Diagnosis Date   A-fib [facility])    s/p sucessful ablation   Asthma    Cardiomyopathy, hypertrophic nonobstructive (HCC)    subaortic valve gradient of with recent congestive heart failure and volume overload, now improved with diuretics   Heart failure 10/2015   Class III/IV pre 2010 surgery > postop has no CHF   ICD (implantable cardioverter-defibrillator) in place    Wears glasses      Tobacco Use: Social History   Tobacco Use  Smoking Status Never  Smokeless Tobacco Never    Labs: Review Flowsheet  More data may exist      Latest Ref Rng & Units 07/17/2013 09/01/2014 02/23/2015 09/23/2015 09/26/2015  Labs for ITP Cardiac and Pulmonary Rehab  Cholestrol 100 - 199 mg/dL 816  813  822  821  -  LDL (calc) 0 - 99 mg/dL 882  882  888  889  -  HDL-C >39 mg/dL 35  31  34  34  -  Trlycerides 0 - 149 mg/dL [phone]  810  840  827  -  Hemoglobin A1c - - - - - 5.2     Capillary Blood Glucose: Lab Results  Component Value Date   GLUCAP 279 (H) 08/03/2024     Exercise Target Goals: Exercise Program Goal: Individual exercise prescription set using results from initial 6 min walk test and THRR while considering  patient's activity barriers and safety.   Exercise Prescription Goal: Starting with aerobic activity 30 plus minutes a day, 3 days per week for initial exercise prescription. Provide home exercise prescription and guidelines that participant acknowledges understanding prior to discharge.  Activity Barriers & Risk Stratification:  Activity Barriers & Cardiac Risk Stratification - 08/03/24 1341       Activity Barriers & Cardiac Risk Stratification   Activity Barriers Shortness of Breath;Assistive Device;Balance Concerns;Deconditioning;Muscular Weakness   Uses rollator.   Cardiac Risk Stratification High  6 Minute Walk:  6 Minute Walk     Row Name 08/03/24 1529         6 Minute Walk   Phase Initial     Distance 700 feet     Walk Time 6 minutes     # of Rest Breaks 0     MPH 1.33     METS 3.17     RPE 14     Perceived Dyspnea  1     VO2 Peak 11.01     Symptoms Yes (comment)     Comments slightly SOB at end     Resting HR 115 bpm     Resting BP 110/70     Resting Oxygen Saturation  99 %     Exercise Oxygen Saturation  during 6 min walk 96 %     Max Ex. HR 137 bpm     Max Ex. BP 164/62     2 Minute Post BP 128/72        Oxygen  Initial Assessment:   Oxygen Re-Evaluation:   Oxygen Discharge (Final Oxygen Re-Evaluation):   Initial Exercise Prescription:  Initial Exercise Prescription - 08/03/24 1500       Date of Initial Exercise RX and Referring Provider   Date 08/03/24    Referring Provider Arnie Cadet MD   Primary Cardiologist: Dr. Nanci Blanch     Oxygen   Maintain Oxygen Saturation 88% or higher      Treadmill   MPH 1.3    Grade 1    Minutes 15    METs 2.17      REL-XR   Level 3    Speed 50    Minutes 15    METs 3      Prescription Details   Frequency (times per week) 3    Duration Progress to 30 minutes of continuous aerobic without signs/symptoms of physical distress      Intensity   THRR 40-80% of Max Heartrate 136-157    Ratings of Perceived Exertion 11-13    Perceived Dyspnea 0-4      Progression   Progression Continue to progress workloads to maintain intensity without signs/symptoms of physical distress.      Resistance Training   Training Prescription Yes    Weight 4 lb    Reps 10-15          Perform Capillary Blood Glucose checks as needed.  Exercise Prescription Changes:   Exercise Prescription Changes     Row Name 08/03/24 1500             Response to Exercise   Blood Pressure (Admit) 110/70       Blood Pressure (Exercise) 164/62       Blood Pressure (Exit) 128/72       Heart Rate (Admit) 115 bpm       Heart Rate (Exercise) 137 bpm       Heart Rate (Exit) 128 bpm       Oxygen Saturation (Admit) 99 %       Oxygen Saturation (Exercise) 96 %       Rating of Perceived Exertion (Exercise) 14       Perceived Dyspnea (Exercise) 1       Symptoms slightly SOB       Comments walk test results          Exercise Comments:   Exercise Goals and Review:   Exercise Goals     Row Name 08/03/24 1533  Exercise Goals   Increase Physical Activity Yes       Intervention Provide advice, education, support and counseling about physical  activity/exercise needs.;Develop an individualized exercise prescription for aerobic and resistive training based on initial evaluation findings, risk stratification, comorbidities and participant's personal goals.       Expected Outcomes Short Term: Attend rehab on a regular basis to increase amount of physical activity.;Long Term: Exercising regularly at least 3-5 days a week.;Long Term: Add in home exercise to make exercise part of routine and to increase amount of physical activity.       Increase Strength and Stamina Yes       Intervention Provide advice, education, support and counseling about physical activity/exercise needs.;Develop an individualized exercise prescription for aerobic and resistive training based on initial evaluation findings, risk stratification, comorbidities and participant's personal goals.       Expected Outcomes Short Term: Increase workloads from initial exercise prescription for resistance, speed, and METs.;Short Term: Perform resistance training exercises routinely during rehab and add in resistance training at home;Long Term: Improve cardiorespiratory fitness, muscular endurance and strength as measured by increased METs and functional capacity ( )       Able to understand and use rate of perceived exertion (RPE) scale Yes       Intervention Provide education and explanation on how to use RPE scale       Expected Outcomes Short Term: Able to use RPE daily in rehab to express subjective intensity level;Long Term:  Able to use RPE to guide intensity level when exercising independently       Able to understand and use Dyspnea scale Yes       Intervention Provide education and explanation on how to use Dyspnea scale       Expected Outcomes Short Term: Able to use Dyspnea scale daily in rehab to express subjective sense of shortness of breath during exertion;Long Term: Able to use Dyspnea scale to guide intensity level when exercising independently       Knowledge and  understanding of Target Heart Rate Range (THRR) Yes       Intervention Provide education and explanation of THRR including how the numbers were predicted and where they are located for reference       Expected Outcomes Short Term: Able to state/look up THRR;Long Term: Able to use THRR to govern intensity when exercising independently;Short Term: Able to use daily as guideline for intensity in rehab       Able to check pulse independently Yes       Intervention Provide education and demonstration on how to check pulse in carotid and radial arteries.;Review the importance of being able to check your own pulse for safety during independent exercise       Expected Outcomes Short Term: Able to explain why pulse checking is important during independent exercise;Long Term: Able to check pulse independently and accurately       Understanding of Exercise Prescription Yes       Intervention Provide education, explanation, and written materials on patient's individual exercise prescription       Expected Outcomes Short Term: Able to explain program exercise prescription;Long Term: Able to explain home exercise prescription to exercise independently          Exercise Goals Re-Evaluation :    Discharge Exercise Prescription (Final Exercise Prescription Changes):  Exercise Prescription Changes - 08/03/24 1500       Response to Exercise   Blood Pressure (Admit) 110/70  Blood Pressure (Exercise) 164/62    Blood Pressure (Exit) 128/72    Heart Rate (Admit) 115 bpm    Heart Rate (Exercise) 137 bpm    Heart Rate (Exit) 128 bpm    Oxygen Saturation (Admit) 99 %    Oxygen Saturation (Exercise) 96 %    Rating of Perceived Exertion (Exercise) 14    Perceived Dyspnea (Exercise) 1    Symptoms slightly SOB    Comments walk test results          Nutrition:  Target Goals: Understanding of nutrition guidelines, daily intake of sodium 1500mg , cholesterol 200mg , calories 30% from fat and 7% or less from  saturated fats, daily to have 5 or more servings of fruits and vegetables.  Biometrics:  Pre Biometrics - 08/03/24 1533       Pre Biometrics   Height 6' 1 (1.854 m)    Weight 252 lb 11.2 oz (114.6 kg)    Waist Circumference 44.25 inches    Hip Circumference 45 inches    Waist to Hip Ratio 0.98 %    BMI (Calculated) 33.35    Grip Strength 27.1 kg    Single Leg Stand 19.6 seconds           Nutrition Therapy Plan and Nutrition Goals:  Nutrition Therapy & Goals - 08/03/24 1534       Intervention Plan   Intervention Prescribe, educate and counsel regarding individualized specific dietary modifications aiming towards targeted core components such as weight, hypertension, lipid management, diabetes, heart failure and other comorbidities.;Nutrition handout(s) given to patient.    Expected Outcomes Short Term Goal: Understand basic principles of dietary content, such as calories, fat, sodium, cholesterol and nutrients.;Long Term Goal: Adherence to prescribed nutrition plan.          Nutrition Assessments:  MEDIFICTS Score Key: >=70 Need to make dietary changes  40-70 Heart Healthy Diet <= 40 Therapeutic Level Cholesterol Diet   Picture Your Plate Scores: <59 Unhealthy dietary pattern with much room for improvement. 41-50 Dietary pattern unlikely to meet recommendations for good health and room for improvement. 51-60 More healthful dietary pattern, with some room for improvement.  >60 Healthy dietary pattern, although there may be some specific behaviors that could be improved.    Nutrition Goals Re-Evaluation:   Nutrition Goals Discharge (Final Nutrition Goals Re-Evaluation):   Psychosocial: Target Goals: Acknowledge presence or absence of significant depression and/or stress, maximize coping skills, provide positive support system. Participant is able to verbalize types and ability to use techniques and skills needed for reducing stress and depression.  Initial  Review & Psychosocial Screening:  Initial Psych Review & Screening - 08/03/24 1428       Initial Review   Current issues with Current Psychotropic Meds;Current Anxiety/Panic;Current Sleep Concerns;Current Depression      Family Dynamics   Good Support System? Yes    Comments Patient's wife, mother, aunt and uncle support him.      Barriers   Psychosocial barriers to participate in program The patient should benefit from training in stress management and relaxation.;There are no identifiable barriers or psychosocial needs.      Screening Interventions   Interventions Encouraged to exercise;To provide support and resources with identified psychosocial needs;Provide feedback about the scores to participant    Expected Outcomes Short Term goal: Utilizing psychosocial counselor, staff and physician to assist with identification of specific Stressors or current issues interfering with healing process. Setting desired goal for each stressor or current issue identified.;Long Term Goal:  Stressors or current issues are controlled or eliminated.;Short Term goal: Identification and review with participant of any Quality of Life or Depression concerns found by scoring the questionnaire.;Long Term goal: The participant improves quality of Life and PHQ9 Scores as seen by post scores and/or verbalization of changes          Quality of Life Scores:  Scores of 19 and below usually indicate a poorer quality of life in these areas.  A difference of  2-3 points is a clinically meaningful difference.  A difference of 2-3 points in the total score of the Quality of Life Index has been associated with significant improvement in overall quality of life, self-image, physical symptoms, and general health in studies assessing change in quality of life.  PHQ-9: Review Flowsheet       08/03/2024  Depression screen PHQ 2/9  Decreased Interest 1  Down, Depressed, Hopeless 1  PHQ - 2 Score 2  Altered sleeping 1   Tired, decreased energy 1  Change in appetite 1  Feeling bad or failure about yourself  1  Trouble concentrating 1  Moving slowly or fidgety/restless 1  Suicidal thoughts 0  PHQ-9 Score 8  Difficult doing work/chores Very difficult   Interpretation of Total Score  Total Score Depression Severity:  1-4 = Minimal depression, 5-9 = Mild depression, 10-14 = Moderate depression, 15-19 = Moderately severe depression, 20-27 = Severe depression   Psychosocial Evaluation and Intervention:  Psychosocial Evaluation - 08/03/24 1441       Psychosocial Evaluation & Interventions   Interventions Stress management education;Relaxation education;Encouraged to exercise with the program and follow exercise prescription    Comments Patient was referred to cardiac rehab with heart transplant 10/25. He has had heart issues long term and did CR in 2015 after having a myectomy. He is an optician, dispensing at [employer]. He has not returned to work and is not sure he will due to being around children and his suppressed immunity. He hopes to find something within the school system to maybe work from home or not be around kids so much. He is currently being treated for both depression and anxiety with Lexapro. He feels his anxiety has worsned since his surgery but it is improving. He says he has trouble falling asleep. His PCP is tying melotonin. He says he has not seen a big difference with the medication. His goals for the program are to get back to normal; improve his strength and stamina and be able to return to work. He has no barriers identified to complete the program.    Expected Outcomes Short Term: Patient will start the program and attend consistently. Long Term: Patient will complete the program meeting personal goals.    Continue Psychosocial Services  Follow up required by staff          Psychosocial Re-Evaluation:   Psychosocial Discharge (Final Psychosocial  Re-Evaluation):   Vocational Rehabilitation: Provide vocational rehab assistance to qualifying candidates.   Vocational Rehab Evaluation & Intervention:  Vocational Rehab - 08/03/24 1355       Initial Vocational Rehab Evaluation & Intervention   Assessment shows need for Vocational Rehabilitation No      Vocational Rehab Re-Evaulation   Comments Patient plans to return to his job.          Education: Education Goals: Education classes will be provided on a weekly basis, covering required topics. Participant will state understanding/return demonstration of topics presented.  Learning Barriers/Preferences:  Learning Barriers/Preferences -  08/03/24 1355       Learning Barriers/Preferences   Learning Barriers None    Learning Preferences Written Material;Skilled Demonstration;Computer/Internet          Education Topics: Hypertension, Hypertension Reduction -Define heart disease and high blood pressure. Discus how high blood pressure affects the body and ways to reduce high blood pressure.   Exercise and Your Heart -Discuss why it is important to exercise, the FITT principles of exercise, normal and abnormal responses to exercise, and how to exercise safely.   Angina -Discuss definition of angina, causes of angina, treatment of angina, and how to decrease risk of having angina.   Cardiac Medications -Review what the following cardiac medications are used for, how they affect the body, and side effects that may occur when taking the medications.  Medications include Aspirin, Beta blockers, calcium channel blockers, ACE Inhibitors, angiotensin receptor blockers, diuretics, digoxin, and antihyperlipidemics.   Congestive Heart Failure -Discuss the definition of CHF, how to live with CHF, the signs and symptoms of CHF, and how keep track of weight and sodium intake.   Heart Disease and Intimacy -Discus the effect sexual activity has on the heart, how changes occur  during intimacy as we age, and safety during sexual activity.   Smoking Cessation / COPD -Discuss different methods to quit smoking, the health benefits of quitting smoking, and the definition of COPD.   Nutrition I: Fats -Discuss the types of cholesterol, what cholesterol does to the heart, and how cholesterol levels can be controlled.   Nutrition II: Labels -Discuss the different components of food labels and how to read food label   Heart Parts/Heart Disease and PAD -Discuss the anatomy of the heart, the pathway of blood circulation through the heart, and these are affected by heart disease.   Stress I: Signs and Symptoms -Discuss the causes of stress, how stress may lead to anxiety and depression, and ways to limit stress.   Stress II: Relaxation -Discuss different types of relaxation techniques to limit stress.   Warning Signs of Stroke / TIA -Discuss definition of a stroke, what the signs and symptoms are of a stroke, and how to identify when someone is having stroke.   Knowledge Questionnaire Score:   Core Components/Risk Factors/Patient Goals at Admission:  Personal Goals and Risk Factors at Admission - 08/03/24 1356       Core Components/Risk Factors/Patient Goals on Admission    Weight Management Weight Maintenance;Yes    Intervention Weight Management: Develop a combined nutrition and exercise program designed to reach desired caloric intake, while maintaining appropriate intake of nutrient and fiber, sodium and fats, and appropriate energy expenditure required for the weight goal.;Weight Management: Provide education and appropriate resources to help participant work on and attain dietary goals.    Admit Weight 252 lb 11.2 oz (114.6 kg)    Goal Weight: Short Term 250 lb (113.4 kg)    Goal Weight: Long Term 250 lb (113.4 kg)    Expected Outcomes Short Term: Continue to assess and modify interventions until short term weight is achieved;Long Term: Adherence to  nutrition and physical activity/exercise program aimed toward attainment of established weight goal;Weight Maintenance: Understanding of the daily nutrition guidelines, which includes 25-35% calories from fat, 7% or less cal from saturated fats, less than 200mg  cholesterol, less than 1.5gm of sodium, & 5 or more servings of fruits and vegetables daily    Improve shortness of breath with ADL's Yes    Intervention Provide education, individualized exercise plan and  daily activity instruction to help decrease symptoms of SOB with activities of daily living.    Expected Outcomes Short Term: Improve cardiorespiratory fitness to achieve a reduction of symptoms when performing ADLs;Long Term: Be able to perform more ADLs without symptoms or delay the onset of symptoms    Diabetes Yes    Intervention Provide education about signs/symptoms and action to take for hypo/hyperglycemia.;Provide education about proper nutrition, including hydration, and aerobic/resistive exercise prescription along with prescribed medications to achieve blood glucose in normal ranges: Fasting glucose 65-99 mg/dL    Expected Outcomes Short Term: Participant verbalizes understanding of the signs/symptoms and immediate care of hyper/hypoglycemia, proper foot care and importance of medication, aerobic/resistive exercise and nutrition plan for blood glucose control.;Long Term: Attainment of HbA1C < 7%.    Hypertension Yes    Intervention Provide education on lifestyle modifcations including regular physical activity/exercise, weight management, moderate sodium restriction and increased consumption of fresh fruit, vegetables, and low fat dairy, alcohol moderation, and smoking cessation.;Monitor prescription use compliance.    Expected Outcomes Short Term: Continued assessment and intervention until BP is < 140/62mm HG in hypertensive participants. < 130/7mm HG in hypertensive participants with diabetes, heart failure or chronic kidney  disease.;Long Term: Maintenance of blood pressure at goal levels.    Lipids Yes    Intervention Provide education and support for participant on nutrition & aerobic/resistive exercise along with prescribed medications to achieve LDL 70mg , HDL >40mg .    Expected Outcomes Short Term: Participant states understanding of desired cholesterol values and is compliant with medications prescribed. Participant is following exercise prescription and nutrition guidelines.;Long Term: Cholesterol controlled with medications as prescribed, with individualized exercise RX and with personalized nutrition plan. Value goals: LDL < 70mg , HDL > 40 mg.          Core Components/Risk Factors/Patient Goals Review:    Core Components/Risk Factors/Patient Goals at Discharge (Final Review):    ITP Comments:  ITP Comments     Row Name 08/03/24 1501 08/04/24 0920         ITP Comments Patient arrived for 1st visit/orientation/education at 1300. Patient was referred to CR by Dr. Jordan Schroder due to Heart Transplant. During orientation advised patient on arrival and appointment times what to wear, what to do before, during and after exercise. Reviewed attendance and class policy.  Pt is scheduled to return Cardiac Rehab on 08/05/24 at 915. Pt was advised to come to class 15 minutes before class starts.  Discussed RPE/Dpysnea scales. Patient participated in warm up stretches. Patient was able to complete 6 minute walk test.  Telemetry:NSR. Patient was measured for the equipment. Discussed equipment safety with patient. Took patient pre-anthropometric measurements. Patient finished visit at 1445. 30 day review completed. ITP sent to Dr. Dorn Ross, Medical Director of Cardiac Rehab. Continue with ITP unless changes are made by physician.  Only oriented yesterday         Comments: 30 day review

## 2024-08-05 ENCOUNTER — Encounter (HOSPITAL_COMMUNITY): Admission: RE | Admit: 2024-08-05 | Discharge: 2024-08-05 | Attending: Surgery | Admitting: Surgery

## 2024-08-05 DIAGNOSIS — Z941 Heart transplant status: Secondary | ICD-10-CM

## 2024-08-05 LAB — GLUCOSE, CAPILLARY
Glucose-Capillary: 231 mg/dL — ABNORMAL HIGH (ref 70–99)
Glucose-Capillary: 244 mg/dL — ABNORMAL HIGH (ref 70–99)

## 2024-08-05 NOTE — Progress Notes (Signed)
 Daily Session Note  Patient Details  Name: Casey Holland MRN: [id number] Date of Birth: [birth year] Referring Provider:   Flowsheet Row CARDIAC REHAB PHASE II ORIENTATION from 08/03/2024 in [facility] CARDIAC REHABILITATION  Referring Provider Arnie Cadet MD  [facility] Cardiologist: Dr. Nanci Patel]    Encounter Date: 08/05/2024  Check In:  Session Check In - 08/05/24 0915       Check-In   Supervising physician immediately available to respond to emergencies See telemetry face sheet for immediately available MD    Location [facility]    Staff Present Rolland Sake BSN, RN;Easter Schinke Vicci, RN, BSN;Heather Con, BS, Exercise Physiologist;Brittany Jackquline, BSN, RN, WTA-C    Virtual Visit No    Medication changes reported     No    Fall or balance concerns reported    No    Warm-up and Cool-down Not performed (comment)    Resistance Training Performed Yes    VAD Patient? No    PAD/SET Patient? No      Pain Assessment   Currently in Pain? No/denies    Multiple Pain Sites No          Capillary Blood Glucose: Results for orders placed or performed during the hospital encounter of 08/05/24 (from the past 24 hours)  Glucose, capillary     Status: Abnormal   Collection Time: 08/05/24  9:30 AM  Result Value Ref Range   Glucose-Capillary 231 (H) 70 - 99 mg/dL      Social History   Tobacco Use  Smoking Status Never  Smokeless Tobacco Never    Goals Met:  Independence with exercise equipment Exercise tolerated well No report of concerns or symptoms today Strength training completed today  Goals Unmet:  Not Applicable  Comments: First full day of exercise!  Patient was oriented to gym and equipment including functions, settings, policies, and procedures.  Patient's individual exercise prescription and treatment plan were reviewed.  All starting workloads were established based on the results of the 6 minute walk test done at initial orientation  visit.  The plan for exercise progression was also introduced and progression will be customized based on patient's performance and goals.

## 2024-08-06 ENCOUNTER — Encounter (HOSPITAL_COMMUNITY)

## 2024-08-07 ENCOUNTER — Encounter (HOSPITAL_COMMUNITY): Payer: Self-pay

## 2024-08-07 ENCOUNTER — Encounter (HOSPITAL_COMMUNITY)

## 2024-08-10 ENCOUNTER — Encounter (HOSPITAL_COMMUNITY)

## 2024-08-10 ENCOUNTER — Telehealth (HOSPITAL_COMMUNITY): Payer: Self-pay

## 2024-08-11 ENCOUNTER — Encounter (HOSPITAL_COMMUNITY)

## 2024-08-12 ENCOUNTER — Encounter (HOSPITAL_COMMUNITY)

## 2024-08-14 ENCOUNTER — Encounter (HOSPITAL_COMMUNITY): Admission: RE | Admit: 2024-08-14 | Discharge: 2024-08-14 | Attending: Surgery | Admitting: Surgery

## 2024-08-14 DIAGNOSIS — Z941 Heart transplant status: Secondary | ICD-10-CM

## 2024-08-14 NOTE — Progress Notes (Signed)
 Incomplete Session Note  Patient Details  Name: GIOVAN PINSKY MRN: [id number] Date of Birth: 05/02/1971 Referring Provider:   Flowsheet Row CARDIAC REHAB PHASE II ORIENTATION from 08/03/2024 in [facility] CARDIAC REHABILITATION  Referring Provider Arnie Cadet MD  [facility] Cardiologist: Dr. Nanci Patel]    Lani JAYSON Port did not complete his rehab session.  Eliberto had to leave early to watch his son graduate.

## 2024-08-14 NOTE — Progress Notes (Deleted)
 Daily Session Note  Patient Details  Name: DEQUANN VANDERVELDEN MRN: [id number] Date of Birth: 1971-08-08 Referring Provider:   Flowsheet Row CARDIAC REHAB PHASE II ORIENTATION from 08/03/2024 in [facility] CARDIAC REHABILITATION  Referring Provider Arnie Cadet MD  [facility] Cardiologist: Dr. Nanci Patel]    Encounter Date: 08/14/2024  Check In:  Session Check In - 08/14/24 0858       Check-In   Supervising physician immediately available to respond to emergencies See telemetry face sheet for immediately available MD    Location [facility]    Staff Present Laymon Rattler, BSN, RN, WTA-C;Heather Con, BS, Exercise Physiologist;Victoria Zina, RN    Virtual Visit No    Medication changes reported     No    Fall or balance concerns reported    No    Tobacco Cessation No Change    Warm-up and Cool-down Performed on first and last piece of equipment    Resistance Training Performed Yes    VAD Patient? No    PAD/SET Patient? No      Pain Assessment   Currently in Pain? No/denies          Capillary Blood Glucose: No results found for this or any previous visit (from the past 24 hours).    Tobacco Use History[1]  Goals Met:  Independence with exercise equipment Exercise tolerated well No report of concerns or symptoms today Strength training completed today  Goals Unmet:  Not Applicable  Comments: Pt able to follow exercise prescription today without complaint.  Will continue to monitor for progression.        [1]  Social History Tobacco Use  Smoking Status Never  Smokeless Tobacco Never

## 2024-08-17 ENCOUNTER — Encounter (HOSPITAL_COMMUNITY)
Admission: RE | Admit: 2024-08-17 | Discharge: 2024-08-17 | Disposition: A | Discharge status: Home / Self Care | Source: Ambulatory Visit | Attending: Surgery | Admitting: Surgery

## 2024-08-17 DIAGNOSIS — Z941 Heart transplant status: Secondary | ICD-10-CM | POA: Diagnosis not present

## 2024-08-17 NOTE — Progress Notes (Signed)
 Daily Session Note  Patient Details  Name: Casey Holland MRN: [id number] Date of Birth: [birth year] Referring Provider:   Flowsheet Row CARDIAC REHAB PHASE II ORIENTATION from 08/03/2024 in [facility] CARDIAC REHABILITATION  Referring Provider Arnie Cadet MD  [facility] Cardiologist: Dr. Nanci Patel]    Encounter Date: 08/17/2024  Check In:   Capillary Blood Glucose: No results found for this or any previous visit (from the past 24 hours).    Tobacco Use History[1]  Goals Met:  Independence with exercise equipment Exercise tolerated well No report of concerns or symptoms today Strength training completed today  Goals Unmet:  Not Applicable  Comments: Pt able to follow exercise prescription today without complaint.  Will continue to monitor for progression.        [1]  Social History Tobacco Use  Smoking Status Never  Smokeless Tobacco Never

## 2024-08-19 ENCOUNTER — Encounter (HOSPITAL_COMMUNITY): Admission: RE | Admit: 2024-08-19 | Discharge: 2024-08-19 | Attending: Surgery | Admitting: Surgery

## 2024-08-19 DIAGNOSIS — Z941 Heart transplant status: Secondary | ICD-10-CM

## 2024-08-19 NOTE — Progress Notes (Signed)
 Daily Session Note  Patient Details  Name: Casey Holland MRN: [id number] Date of Birth: 25-Sep-1970 Referring Provider:   Flowsheet Row CARDIAC REHAB PHASE II ORIENTATION from 08/03/2024 in [facility] CARDIAC REHABILITATION  Referring Provider Arnie Cadet MD  [facility] Cardiologist: Dr. Nanci Patel]    Encounter Date: 08/19/2024  Check In:  Session Check In - 08/19/24 0920       Check-In   Supervising physician immediately available to respond to emergencies See telemetry face sheet for immediately available MD    Location [facility]    Staff Present Laymon Rattler, BSN, RN, Rosalba Gelineau, MA, RCEP, CCRP, CCET;Kaylla Cobos [employer], RN    Virtual Visit No    Medication changes reported     No    Fall or balance concerns reported    No    Tobacco Cessation No Change    Warm-up and Cool-down Performed on first and last piece of equipment    Resistance Training Performed Yes    VAD Patient? No    PAD/SET Patient? No      Pain Assessment   Currently in Pain? No/denies    Multiple Pain Sites No          Capillary Blood Glucose: No results found for this or any previous visit (from the past 24 hours).    Tobacco Use History[1]  Goals Met:  Independence with exercise equipment Exercise tolerated well No report of concerns or symptoms today Strength training completed today  Goals Unmet:  Not Applicable  Comments: SABRASABRAPt able to follow exercise prescription today without complaint.  Will continue to monitor for progression.        [1]  Social History Tobacco Use  Smoking Status Never  Smokeless Tobacco Never

## 2024-08-21 ENCOUNTER — Encounter (HOSPITAL_COMMUNITY): Admission: RE | Admit: 2024-08-21 | Discharge: 2024-08-21 | Attending: Surgery | Admitting: Surgery

## 2024-08-21 DIAGNOSIS — Z941 Heart transplant status: Secondary | ICD-10-CM

## 2024-08-21 NOTE — Progress Notes (Signed)
 Daily Session Note  Patient Details  Name: Casey Holland MRN: [id number] Date of Birth: 06-06-1971 Referring Provider:   Flowsheet Row CARDIAC REHAB PHASE II ORIENTATION from 08/03/2024 in [facility] CARDIAC REHABILITATION  Referring Provider Arnie Cadet MD  [facility] Cardiologist: Dr. Nanci Patel]    Encounter Date: 08/21/2024  Check In:  Session Check In - 08/21/24 0916       Check-In   Supervising physician immediately available to respond to emergencies See telemetry face sheet for immediately available MD    Location [facility]    Staff Present Richerd Buddle, RN;Jessica [place], MA, RCEP, CCRP, CCET    Virtual Visit No    Medication changes reported     No    Fall or balance concerns reported    No    Warm-up and Cool-down Performed on first and last piece of equipment    Resistance Training Performed Yes    VAD Patient? No    PAD/SET Patient? No      Pain Assessment   Currently in Pain? No/denies          Capillary Blood Glucose: No results found for this or any previous visit (from the past 24 hours).    Tobacco Use History[1]  Goals Met:  Independence with exercise equipment Exercise tolerated well No report of concerns or symptoms today Strength training completed today  Goals Unmet:  Not Applicable  Comments: Pt able to follow exercise prescription today without complaint.  Will continue to monitor for progression.        [1]  Social History Tobacco Use  Smoking Status Never  Smokeless Tobacco Never

## 2024-08-21 NOTE — Telephone Encounter (Signed)
" ° ° [] °  My Junie [facility]              08/10/2024 09:26 AM EST by Zina Richerd DASEN, RN  Outgoing [address], [place] (Self) [phone] (Mobile) Remove  No Answer/Busy - Missed rehab session today. No answer, left VM.       "

## 2024-08-24 ENCOUNTER — Encounter (HOSPITAL_COMMUNITY): Admission: RE | Admit: 2024-08-24 | Discharge: 2024-08-24 | Attending: Surgery | Admitting: Surgery

## 2024-08-24 DIAGNOSIS — Z941 Heart transplant status: Secondary | ICD-10-CM

## 2024-08-24 NOTE — Progress Notes (Signed)
 Daily Session Note  Patient Details  Name: Casey Holland MRN: [id number] Date of Birth: Dec 10, 1970 Referring Provider:   Flowsheet Row CARDIAC REHAB PHASE II ORIENTATION from 08/03/2024 in [facility] CARDIAC REHABILITATION  Referring Provider Arnie Cadet MD  [facility] Cardiologist: Dr. Nanci Patel]    Encounter Date: 08/24/2024  Check In:  Session Check In - 08/24/24 0917       Check-In   Staff Present Harlene Gelineau, MA, RCEP, CCRP, CCET;Brittany Jackquline, BSN, RN, WTA-C;Rosea Dory Con, BS, Exercise Physiologist    Virtual Visit No    Medication changes reported     No    Fall or balance concerns reported    No    Tobacco Cessation No Change    Warm-up and Cool-down Performed on first and last piece of equipment    Resistance Training Performed Yes    VAD Patient? No    PAD/SET Patient? No      Pain Assessment   Currently in Pain? No/denies    Multiple Pain Sites No          Capillary Blood Glucose: No results found for this or any previous visit (from the past 24 hours).    Tobacco Use History[1]  Goals Met:  Independence with exercise equipment Exercise tolerated well No report of concerns or symptoms today Strength training completed today  Goals Unmet:  Not Applicable  Comments: Pt able to follow exercise prescription today without complaint.  Will continue to monitor for progression.        [1]  Social History Tobacco Use  Smoking Status Never  Smokeless Tobacco Never

## 2024-08-26 ENCOUNTER — Encounter (HOSPITAL_COMMUNITY)

## 2024-08-31 ENCOUNTER — Encounter (HOSPITAL_COMMUNITY)
Admission: RE | Admit: 2024-08-31 | Discharge: 2024-08-31 | Disposition: A | Discharge status: Home / Self Care | Source: Ambulatory Visit | Attending: Surgery | Admitting: Surgery

## 2024-08-31 DIAGNOSIS — Z941 Heart transplant status: Secondary | ICD-10-CM | POA: Diagnosis not present

## 2024-08-31 NOTE — Progress Notes (Signed)
 Daily Session Note  Patient Details  Name: Casey Holland MRN: [id number] Date of Birth: Mar 22, 1971 Referring Provider:   Flowsheet Row CARDIAC REHAB PHASE II ORIENTATION from 08/03/2024 in [facility] CARDIAC REHABILITATION  Referring Provider Arnie Cadet MD  [facility] Cardiologist: Dr. Nanci Patel]    Encounter Date: 08/31/2024  Check In:  Session Check In - 08/31/24 0901       Check-In   Supervising physician immediately available to respond to emergencies See telemetry face sheet for immediately available MD    Location [facility]    Staff Present Harlene Gelineau, MA, RCEP, CCRP, CCET;Rei Contee Jackquline, BSN, RN, WTA-C    Virtual Visit No    Medication changes reported     No    Fall or balance concerns reported    No    Tobacco Cessation No Change    Warm-up and Cool-down Performed on first and last piece of equipment    Resistance Training Performed Yes    VAD Patient? No    PAD/SET Patient? No      Pain Assessment   Currently in Pain? No/denies          Capillary Blood Glucose: No results found for this or any previous visit (from the past 24 hours).    Tobacco Use History[1]  Goals Met:  Independence with exercise equipment Exercise tolerated well No report of concerns or symptoms today Strength training completed today  Goals Unmet:  Not Applicable  Comments: Pt able to follow exercise prescription today without complaint.  Will continue to monitor for progression.        [1]  Social History Tobacco Use  Smoking Status Never  Smokeless Tobacco Never

## 2024-09-01 ENCOUNTER — Encounter (HOSPITAL_COMMUNITY): Payer: Self-pay | Admitting: *Deleted

## 2024-09-01 DIAGNOSIS — Z941 Heart transplant status: Secondary | ICD-10-CM

## 2024-09-01 NOTE — Progress Notes (Signed)
 Cardiac Individual Treatment Plan  Patient Details  Name: Casey Holland MRN: [id number] Date of Birth: 1971/03/13 Referring Provider:   Flowsheet Row CARDIAC REHAB PHASE II ORIENTATION from 08/03/2024 in [facility] CARDIAC REHABILITATION  Referring Provider Arnie Cadet MD  [Primary Cardiologist: Dr. Nanci Patel]    Initial Encounter Date:  Flowsheet Row CARDIAC REHAB PHASE II ORIENTATION from 08/03/2024 in [place] CARDIAC REHABILITATION  Date 08/03/24    Visit Diagnosis: Status post heart transplant [facility])  Patient's Home Medications on Admission: Current Medications[1]  Past Medical History: Past Medical History:  Diagnosis Date   A-fib [facility])    s/p sucessful ablation   Asthma    Cardiomyopathy, hypertrophic nonobstructive (HCC)    subaortic valve gradient of with recent congestive heart failure and volume overload, now improved with diuretics   Heart failure 10/2015   Class III/IV pre 2010 surgery > postop has no CHF   ICD (implantable cardioverter-defibrillator) in place    Wears glasses     Tobacco Use: Tobacco Use History[2]  Labs: Review Flowsheet  More data may exist      Latest Ref Rng & Units 07/17/2013 09/01/2014 02/23/2015 09/23/2015 09/26/2015  Labs for ITP Cardiac and Pulmonary Rehab  Cholestrol 100 - 199 mg/dL 816  813  822  821  -  LDL (calc) 0 - 99 mg/dL 882  882  888  889  -  HDL-C >39 mg/dL 35  31  34  34  -  Trlycerides 0 - 149 mg/dL 842  810  840  827  -  Hemoglobin A1c - - - - - 5.2     Capillary Blood Glucose: Lab Results  Component Value Date   GLUCAP 244 (H) 08/05/2024   GLUCAP 231 (H) 08/05/2024   GLUCAP 279 (H) 08/03/2024     Exercise Target Goals: Exercise Program Goal: Individual exercise prescription set using results from initial 6 min walk test and THRR while considering  patients activity barriers and safety.   Exercise Prescription Goal: Starting with aerobic activity 30 plus minutes a day, 3 days per week for  initial exercise prescription. Provide home exercise prescription and guidelines that participant acknowledges understanding prior to discharge.  Activity Barriers & Risk Stratification:  Activity Barriers & Cardiac Risk Stratification - 08/03/24 1341       Activity Barriers & Cardiac Risk Stratification   Activity Barriers Shortness of Breath;Assistive Device;Balance Concerns;Deconditioning;Muscular Weakness   Uses rollator.   Cardiac Risk Stratification High          6 Minute Walk:  6 Minute Walk     Row Name 08/03/24 1529         6 Minute Walk   Phase Initial     Distance 700 feet     Walk Time 6 minutes     # of Rest Breaks 0     MPH 1.33     METS 3.17     RPE 14     Perceived Dyspnea  1     VO2 Peak 11.01     Symptoms Yes (comment)     Comments slightly SOB at end     Resting HR 115 bpm     Resting BP 110/70     Resting Oxygen Saturation  99 %     Exercise Oxygen Saturation  during 6 min walk 96 %     Max Ex. HR 137 bpm     Max Ex. BP 164/62     2 Minute Post  BP 128/72        Oxygen Initial Assessment:   Oxygen Re-Evaluation:   Oxygen Discharge (Final Oxygen Re-Evaluation):   Initial Exercise Prescription:  Initial Exercise Prescription - 08/03/24 1500       Date of Initial Exercise RX and Referring Provider   Date 08/03/24    Referring Provider Arnie Cadet MD   Primary Cardiologist: Dr. Nanci Blanch     Oxygen   Maintain Oxygen Saturation 88% or higher      Treadmill   MPH 1.3    Grade 1    Minutes 15    METs 2.17      REL-XR   Level 3    Speed 50    Minutes 15    METs 3      Prescription Details   Frequency (times per week) 3    Duration Progress to 30 minutes of continuous aerobic without signs/symptoms of physical distress      Intensity   THRR 40-80% of Max Heartrate 136-157    Ratings of Perceived Exertion 11-13    Perceived Dyspnea 0-4      Progression   Progression Continue to progress workloads to maintain  intensity without signs/symptoms of physical distress.      Resistance Training   Training Prescription Yes    Weight 4 lb    Reps 10-15          Perform Capillary Blood Glucose checks as needed.  Exercise Prescription Changes:   Exercise Prescription Changes     Row Name 08/03/24 1500 08/18/24 0800           Response to Exercise   Blood Pressure (Admit) 110/70 130/66      Blood Pressure (Exercise) 164/62 144/72      Blood Pressure (Exit) 128/72 120/90      Heart Rate (Admit) 115 bpm 92 bpm      Heart Rate (Exercise) 137 bpm 129 bpm      Heart Rate (Exit) 128 bpm 116 bpm      Oxygen Saturation (Admit) 99 % --      Oxygen Saturation (Exercise) 96 % --      Rating of Perceived Exertion (Exercise) 14 13      Perceived Dyspnea (Exercise) 1 --      Symptoms slightly SOB --      Comments walk test results --      Duration -- Continue with 30 min of aerobic exercise without signs/symptoms of physical distress.      Intensity -- THRR unchanged        Progression   Progression -- Continue to progress workloads to maintain intensity without signs/symptoms of physical distress.        Resistance Training   Weight -- 4      Reps -- 10-15        Treadmill   MPH -- 1.8      Grade -- 0      Minutes -- 15      METs -- 2.38        REL-XR   Level -- 2      Speed -- 50      Minutes -- 15      METs -- 3.7         Exercise Comments:   Exercise Goals and Review:   Exercise Goals     Row Name 08/03/24 1533             Exercise Goals  Increase Physical Activity Yes       Intervention Provide advice, education, support and counseling about physical activity/exercise needs.;Develop an individualized exercise prescription for aerobic and resistive training based on initial evaluation findings, risk stratification, comorbidities and participant's personal goals.       Expected Outcomes Short Term: Attend rehab on a regular basis to increase amount of physical  activity.;Long Term: Exercising regularly at least 3-5 days a week.;Long Term: Add in home exercise to make exercise part of routine and to increase amount of physical activity.       Increase Strength and Stamina Yes       Intervention Provide advice, education, support and counseling about physical activity/exercise needs.;Develop an individualized exercise prescription for aerobic and resistive training based on initial evaluation findings, risk stratification, comorbidities and participant's personal goals.       Expected Outcomes Short Term: Increase workloads from initial exercise prescription for resistance, speed, and METs.;Short Term: Perform resistance training exercises routinely during rehab and add in resistance training at home;Long Term: Improve cardiorespiratory fitness, muscular endurance and strength as measured by increased METs and functional capacity ( )       Able to understand and use rate of perceived exertion (RPE) scale Yes       Intervention Provide education and explanation on how to use RPE scale       Expected Outcomes Short Term: Able to use RPE daily in rehab to express subjective intensity level;Long Term:  Able to use RPE to guide intensity level when exercising independently       Able to understand and use Dyspnea scale Yes       Intervention Provide education and explanation on how to use Dyspnea scale       Expected Outcomes Short Term: Able to use Dyspnea scale daily in rehab to express subjective sense of shortness of breath during exertion;Long Term: Able to use Dyspnea scale to guide intensity level when exercising independently       Knowledge and understanding of Target Heart Rate Range (THRR) Yes       Intervention Provide education and explanation of THRR including how the numbers were predicted and where they are located for reference       Expected Outcomes Short Term: Able to state/look up THRR;Long Term: Able to use THRR to govern intensity when  exercising independently;Short Term: Able to use daily as guideline for intensity in rehab       Able to check pulse independently Yes       Intervention Provide education and demonstration on how to check pulse in carotid and radial arteries.;Review the importance of being able to check your own pulse for safety during independent exercise       Expected Outcomes Short Term: Able to explain why pulse checking is important during independent exercise;Long Term: Able to check pulse independently and accurately       Understanding of Exercise Prescription Yes       Intervention Provide education, explanation, and written materials on patient's individual exercise prescription       Expected Outcomes Short Term: Able to explain program exercise prescription;Long Term: Able to explain home exercise prescription to exercise independently          Exercise Goals Re-Evaluation :  Exercise Goals Re-Evaluation     Row Name 08/17/24 9061 08/18/24 0835           Exercise Goal Re-Evaluation   Exercise Goals Review Increase Physical Activity;Increase Strength and Stamina;Understanding of  Exercise Prescription Increase Physical Activity;Increase Strength and Stamina;Understanding of Exercise Prescription      Comments Aarron has been doing well in rehab. He has noticed slight increase in his stamini since coming to rehab. He still feels tired after but this will increrase over time. He is no longer having to use his rollator to help walk. He is not really exercsiing outside of rehab but has been doing actiivites such as going to the store and to family so this does make him tired at the end of the day. Yarnell has completed 5 sessions of CR. He is doing well and seeing slight improvment. He is walking well now on the treadmill. His level on the XR is now at 2. Will continue to monitor and progress asable      Expected Outcomes short; add walking to exercise when at home    long:continue to exercise and attend  rehab Short: continue to attend rehab   long:add exercise outside rehab          Discharge Exercise Prescription (Final Exercise Prescription Changes):  Exercise Prescription Changes - 08/18/24 0800       Response to Exercise   Blood Pressure (Admit) 130/66    Blood Pressure (Exercise) 144/72    Blood Pressure (Exit) 120/90    Heart Rate (Admit) 92 bpm    Heart Rate (Exercise) 129 bpm    Heart Rate (Exit) 116 bpm    Rating of Perceived Exertion (Exercise) 13    Duration Continue with 30 min of aerobic exercise without signs/symptoms of physical distress.    Intensity THRR unchanged      Progression   Progression Continue to progress workloads to maintain intensity without signs/symptoms of physical distress.      Resistance Training   Weight 4    Reps 10-15      Treadmill   MPH 1.8    Grade 0    Minutes 15    METs 2.38      REL-XR   Level 2    Speed 50    Minutes 15    METs 3.7          Nutrition:  Target Goals: Understanding of nutrition guidelines, daily intake of sodium 1500mg , cholesterol 200mg , calories 30% from fat and 7% or less from saturated fats, daily to have 5 or more servings of fruits and vegetables.  Biometrics:  Pre Biometrics - 08/03/24 1533       Pre Biometrics   Height 6' 1 (1.854 m)    Weight 252 lb 11.2 oz (114.6 kg)    Waist Circumference 44.25 inches    Hip Circumference 45 inches    Waist to Hip Ratio 0.98 %    BMI (Calculated) 33.35    Grip Strength 27.1 kg    Single Leg Stand 19.6 seconds           Nutrition Therapy Plan and Nutrition Goals:  Nutrition Therapy & Goals - 08/03/24 1534       Intervention Plan   Intervention Prescribe, educate and counsel regarding individualized specific dietary modifications aiming towards targeted core components such as weight, hypertension, lipid management, diabetes, heart failure and other comorbidities.;Nutrition handout(s) given to patient.    Expected Outcomes Short Term Goal:  Understand basic principles of dietary content, such as calories, fat, sodium, cholesterol and nutrients.;Long Term Goal: Adherence to prescribed nutrition plan.          Nutrition Assessments:  MEDIFICTS Score Key: >=70 Need to make dietary  changes  40-70 Heart Healthy Diet <= 40 Therapeutic Level Cholesterol Diet  Flowsheet Row Documentation from 08/07/2024 in [facility] CARDIAC REHABILITATION  Picture Your Plate Total Score on Admission 58   Picture Your Plate Scores: <59 Unhealthy dietary pattern with much room for improvement. 41-50 Dietary pattern unlikely to meet recommendations for good health and room for improvement. 51-60 More healthful dietary pattern, with some room for improvement.  >60 Healthy dietary pattern, although there may be some specific behaviors that could be improved.    Nutrition Goals Re-Evaluation:  Nutrition Goals Re-Evaluation     Row Name 08/17/24 0946             Goals   Nutrition Goal Healthy eating       Comment Keiyon is doing well in rehab. He stated that he is trying to eat healthier but also does not eat much due to not felling hungery. he stated that he does stay very thristy dueint the day and will drink water and also zero sugar gatorades. we talked about just watching sodium when drinking the gatorades. He is eating mostly chicken in his diet. He is trying to include vveggies but stated that he really isnt a fan of them. He will eat green beans. He is a sweets person and is trying to cut back on the amount of sweets he eats. With the holidays he stated taht it has been hard due to desserts at holiday gatherings.       Expected Outcome Short: try out different veggies in diet and moderation on sweets   long: continue with healthy eating          Nutrition Goals Discharge (Final Nutrition Goals Re-Evaluation):  Nutrition Goals Re-Evaluation - 08/17/24 0946       Goals   Nutrition Goal Healthy eating    Comment Achillies is doing well in  rehab. He stated that he is trying to eat healthier but also does not eat much due to not felling hungery. he stated that he does stay very thristy dueint the day and will drink water and also zero sugar gatorades. we talked about just watching sodium when drinking the gatorades. He is eating mostly chicken in his diet. He is trying to include vveggies but stated that he really isnt a fan of them. He will eat green beans. He is a sweets person and is trying to cut back on the amount of sweets he eats. With the holidays he stated taht it has been hard due to desserts at holiday gatherings.    Expected Outcome Short: try out different veggies in diet and moderation on sweets   long: continue with healthy eating          Psychosocial: Target Goals: Acknowledge presence or absence of significant depression and/or stress, maximize coping skills, provide positive support system. Participant is able to verbalize types and ability to use techniques and skills needed for reducing stress and depression.  Initial Review & Psychosocial Screening:  Initial Psych Review & Screening - 08/03/24 1428       Initial Review   Current issues with Current Psychotropic Meds;Current Anxiety/Panic;Current Sleep Concerns;Current Depression      Family Dynamics   Good Support System? Yes    Comments Patient's wife, mother, aunt and uncle support him.      Barriers   Psychosocial barriers to participate in program The patient should benefit from training in stress management and relaxation.;There are no identifiable barriers or psychosocial needs.  Screening Interventions   Interventions Encouraged to exercise;To provide support and resources with identified psychosocial needs;Provide feedback about the scores to participant    Expected Outcomes Short Term goal: Utilizing psychosocial counselor, staff and physician to assist with identification of specific Stressors or current issues interfering with healing  process. Setting desired goal for each stressor or current issue identified.;Long Term Goal: Stressors or current issues are controlled or eliminated.;Short Term goal: Identification and review with participant of any Quality of Life or Depression concerns found by scoring the questionnaire.;Long Term goal: The participant improves quality of Life and PHQ9 Scores as seen by post scores and/or verbalization of changes          Quality of Life Scores:  Quality of Life - 08/07/24 0822       Quality of Life   Select Quality of Life      Quality of Life Scores   Health/Function Pre 18.43 %    Socioeconomic Pre 19.44 %    Psych/Spiritual Pre 22.36 %    Family Pre 27.6 %    GLOBAL Pre 20.76 %         Scores of 19 and below usually indicate a poorer quality of life in these areas.  A difference of  2-3 points is a clinically meaningful difference.  A difference of 2-3 points in the total score of the Quality of Life Index has been associated with significant improvement in overall quality of life, self-image, physical symptoms, and general health in studies assessing change in quality of life.  PHQ-9: Review Flowsheet       08/03/2024  Depression screen PHQ 2/9  Decreased Interest 1  Down, Depressed, Hopeless 1  PHQ - 2 Score 2  Altered sleeping 1  Tired, decreased energy 1  Change in appetite 1  Feeling bad or failure about yourself  1  Trouble concentrating 1  Moving slowly or fidgety/restless 1  Suicidal thoughts 0  PHQ-9 Score 8  Difficult doing work/chores Very difficult   Interpretation of Total Score  Total Score Depression Severity:  1-4 = Minimal depression, 5-9 = Mild depression, 10-14 = Moderate depression, 15-19 = Moderately severe depression, 20-27 = Severe depression   Psychosocial Evaluation and Intervention:  Psychosocial Evaluation - 08/03/24 1441       Psychosocial Evaluation & Interventions   Interventions Stress management education;Relaxation  education;Encouraged to exercise with the program and follow exercise prescription    Comments Patient was referred to cardiac rehab with heart transplant 10/25. He has had heart issues long term and did CR in 2015 after having a myectomy. He is an optician, dispensing at [employer]. He has not returned to work and is not sure he will due to being around children and his suppressed immunity. He hopes to find something within the school system to maybe work from home or not be around kids so much. He is currently being treated for both depression and anxiety with Lexapro. He feels his anxiety has worsned since his surgery but it is improving. He says he has trouble falling asleep. His PCP is tying melotonin. He says he has not seen a big difference with the medication. His goals for the program are to get back to normal; improve his strength and stamina and be able to return to work. He has no barriers identified to complete the program.    Expected Outcomes Short Term: Patient will start the program and attend consistently. Long Term: Patient will complete the program meeting  personal goals.    Continue Psychosocial Services  Follow up required by staff          Psychosocial Re-Evaluation:  Psychosocial Re-Evaluation     Row Name 08/17/24 [phone]             Psychosocial Re-Evaluation   Current issues with None Identified       Comments Thanos is doing well in rehab. he stated that he really does not have any sleep issues. He does get up about twice a night to use the restroom but has no issues with going back to sleep. He also stated that he does not have any main stressors in his life other then normal everyday stress. He is postitive person and wants to go back to work but knows itll have to be in a different job due to not wanting to be in schools due to all the germs       Expected Outcomes Short: continue to exercise to help with stres and sleep   long: continue to have outlet for  stress       Interventions Encouraged to attend Cardiac Rehabilitation for the exercise       Continue Psychosocial Services  Follow up required by staff          Psychosocial Discharge (Final Psychosocial Re-Evaluation):  Psychosocial Re-Evaluation - 08/17/24 0943       Psychosocial Re-Evaluation   Current issues with None Identified    Comments Lejuan is doing well in rehab. he stated that he really does not have any sleep issues. He does get up about twice a night to use the restroom but has no issues with going back to sleep. He also stated that he does not have any main stressors in his life other then normal everyday stress. He is postitive person and wants to go back to work but knows itll have to be in a different job due to not wanting to be in schools due to all the germs    Expected Outcomes Short: continue to exercise to help with stres and sleep   long: continue to have outlet for stress    Interventions Encouraged to attend Cardiac Rehabilitation for the exercise    Continue Psychosocial Services  Follow up required by staff          Vocational Rehabilitation: Provide vocational rehab assistance to qualifying candidates.   Vocational Rehab Evaluation & Intervention:  Vocational Rehab - 08/03/24 1355       Initial Vocational Rehab Evaluation & Intervention   Assessment shows need for Vocational Rehabilitation No      Vocational Rehab Re-Evaulation   Comments Patient plans to return to his job.          Education: Education Goals: Education classes will be provided on a weekly basis, covering required topics. Participant will state understanding/return demonstration of topics presented.  Learning Barriers/Preferences:  Learning Barriers/Preferences - 08/03/24 1355       Learning Barriers/Preferences   Learning Barriers None    Learning Preferences Written Material;Skilled Demonstration;Computer/Internet          Education Topics: Hypertension,  Hypertension Reduction -Define heart disease and high blood pressure. Discus how high blood pressure affects the body and ways to reduce high blood pressure.   Exercise and Your Heart -Discuss why it is important to exercise, the FITT principles of exercise, normal and abnormal responses to exercise, and how to exercise safely.   Angina -Discuss definition of angina, causes of angina, treatment  of angina, and how to decrease risk of having angina.   Cardiac Medications -Review what the following cardiac medications are used for, how they affect the body, and side effects that may occur when taking the medications.  Medications include Aspirin, Beta blockers, calcium channel blockers, ACE Inhibitors, angiotensin receptor blockers, diuretics, digoxin, and antihyperlipidemics.   Congestive Heart Failure -Discuss the definition of CHF, how to live with CHF, the signs and symptoms of CHF, and how keep track of weight and sodium intake.   Heart Disease and Intimacy -Discus the effect sexual activity has on the heart, how changes occur during intimacy as we age, and safety during sexual activity.   Smoking Cessation / COPD -Discuss different methods to quit smoking, the health benefits of quitting smoking, and the definition of COPD.   Nutrition I: Fats -Discuss the types of cholesterol, what cholesterol does to the heart, and how cholesterol levels can be controlled.   Nutrition II: Labels -Discuss the different components of food labels and how to read food label   Heart Parts/Heart Disease and PAD -Discuss the anatomy of the heart, the pathway of blood circulation through the heart, and these are affected by heart disease.   Stress I: Signs and Symptoms -Discuss the causes of stress, how stress may lead to anxiety and depression, and ways to limit stress.   Stress II: Relaxation -Discuss different types of relaxation techniques to limit stress.   Warning Signs of Stroke /  TIA -Discuss definition of a stroke, what the signs and symptoms are of a stroke, and how to identify when someone is having stroke.   Knowledge Questionnaire Score:  Knowledge Questionnaire Score - 08/07/24 9177       Knowledge Questionnaire Score   Pre Score 22/26          Core Components/Risk Factors/Patient Goals at Admission:  Personal Goals and Risk Factors at Admission - 08/03/24 1356       Core Components/Risk Factors/Patient Goals on Admission    Weight Management Weight Maintenance;Yes    Intervention Weight Management: Develop a combined nutrition and exercise program designed to reach desired caloric intake, while maintaining appropriate intake of nutrient and fiber, sodium and fats, and appropriate energy expenditure required for the weight goal.;Weight Management: Provide education and appropriate resources to help participant work on and attain dietary goals.    Admit Weight 252 lb 11.2 oz (114.6 kg)    Goal Weight: Short Term 250 lb (113.4 kg)    Goal Weight: Long Term 250 lb (113.4 kg)    Expected Outcomes Short Term: Continue to assess and modify interventions until short term weight is achieved;Long Term: Adherence to nutrition and physical activity/exercise program aimed toward attainment of established weight goal;Weight Maintenance: Understanding of the daily nutrition guidelines, which includes 25-35% calories from fat, 7% or less cal from saturated fats, less than 200mg  cholesterol, less than 1.5gm of sodium, & 5 or more servings of fruits and vegetables daily    Improve shortness of breath with ADL's Yes    Intervention Provide education, individualized exercise plan and daily activity instruction to help decrease symptoms of SOB with activities of daily living.    Expected Outcomes Short Term: Improve cardiorespiratory fitness to achieve a reduction of symptoms when performing ADLs;Long Term: Be able to perform more ADLs without symptoms or delay the onset of  symptoms    Diabetes Yes    Intervention Provide education about signs/symptoms and action to take for hypo/hyperglycemia.;Provide education about proper nutrition,  including hydration, and aerobic/resistive exercise prescription along with prescribed medications to achieve blood glucose in normal ranges: Fasting glucose 65-99 mg/dL    Expected Outcomes Short Term: Participant verbalizes understanding of the signs/symptoms and immediate care of hyper/hypoglycemia, proper foot care and importance of medication, aerobic/resistive exercise and nutrition plan for blood glucose control.;Long Term: Attainment of HbA1C < 7%.    Hypertension Yes    Intervention Provide education on lifestyle modifcations including regular physical activity/exercise, weight management, moderate sodium restriction and increased consumption of fresh fruit, vegetables, and low fat dairy, alcohol moderation, and smoking cessation.;Monitor prescription use compliance.    Expected Outcomes Short Term: Continued assessment and intervention until BP is < 140/17mm HG in hypertensive participants. < 130/32mm HG in hypertensive participants with diabetes, heart failure or chronic kidney disease.;Long Term: Maintenance of blood pressure at goal levels.    Lipids Yes    Intervention Provide education and support for participant on nutrition & aerobic/resistive exercise along with prescribed medications to achieve LDL 70mg , HDL >40mg .    Expected Outcomes Short Term: Participant states understanding of desired cholesterol values and is compliant with medications prescribed. Participant is following exercise prescription and nutrition guidelines.;Long Term: Cholesterol controlled with medications as prescribed, with individualized exercise RX and with personalized nutrition plan. Value goals: LDL < 70mg , HDL > 40 mg.          Core Components/Risk Factors/Patient Goals Review:   Goals and Risk Factor Review     Row Name 08/17/24 0950              Core Components/Risk Factors/Patient Goals Review   Personal Goals Review Weight Management/Obesity;Other       Review Kadeen is doing well in rehab. He has sliggh increase in his stamini and no longer needs the rollator. He is trying to eat helathier and is monitoring his weight for any fluid increase. He is exercise well in class but does not exercise outsde class.He sometimes checks his BP at home but has noticed that his BP in his LUE is good and in BP in RUE is high/low (all over the place). We talked about monitoring the BP and talking to cardio about the difference       Expected Outcomes Short: continue to monitor BP   long: continue to attend rehab and exercise          Core Components/Risk Factors/Patient Goals at Discharge (Final Review):   Goals and Risk Factor Review - 08/17/24 0950       Core Components/Risk Factors/Patient Goals Review   Personal Goals Review Weight Management/Obesity;Other    Review Zadyn is doing well in rehab. He has sliggh increase in his stamini and no longer needs the rollator. He is trying to eat helathier and is monitoring his weight for any fluid increase. He is exercise well in class but does not exercise outsde class.He sometimes checks his BP at home but has noticed that his BP in his LUE is good and in BP in RUE is high/low (all over the place). We talked about monitoring the BP and talking to cardio about the difference    Expected Outcomes Short: continue to monitor BP   long: continue to attend rehab and exercise          ITP Comments:  ITP Comments     Row Name 08/03/24 1501 08/04/24 0920 08/05/24 0943 09/01/24 1248     ITP Comments Patient arrived for 1st visit/orientation/education at 1300. Patient was referred to CR by  Dr. Jordan Schroder due to Heart Transplant. During orientation advised patient on arrival and appointment times what to wear, what to do before, during and after exercise. Reviewed attendance and class policy.   Pt is scheduled to return Cardiac Rehab on 08/05/24 at 915. Pt was advised to come to class 15 minutes before class starts.  Discussed RPE/Dpysnea scales. Patient participated in warm up stretches. Patient was able to complete 6 minute walk test.  Telemetry:NSR. Patient was measured for the equipment. Discussed equipment safety with patient. Took patient pre-anthropometric measurements. Patient finished visit at 1445. 30 day review completed. ITP sent to Dr. Dorn Ross, Medical Director of Cardiac Rehab. Continue with ITP unless changes are made by physician.  Only oriented yesterday First full day of exercise!  Patient was oriented to gym and equipment including functions, settings, policies, and procedures.  Patient's individual exercise prescription and treatment plan were reviewed.  All starting workloads were established based on the results of the 6 minute walk test done at initial orientation visit.  The plan for exercise progression was also introduced and progression will be customized based on patient's performance and goals. 30 day review completed. ITP sent to Dr. Dorn Ross, Medical Director of Cardiac Rehab. Continue with ITP unless changes are made by physician.       Comments: 30 day review     [1]  Current Outpatient Medications:    Accu-Chek Softclix Lancets lancets, 1 each by Other route as directed., Disp: , Rfl:    acetaminophen  (TYLENOL ) 500 MG tablet, Take 1,000-1,500 mg by mouth every 6 (six) hours as needed for moderate pain or headache., Disp: , Rfl:    amoxicillin (AMOXIL) 500 MG capsule, Take 2,000 mg by mouth as directed., Disp: , Rfl:    aspirin EC 81 MG tablet, Take 81 mg by mouth daily., Disp: , Rfl:    Cholecalciferol (DIALYVITE VITAMIN D 5000) 125 MCG (5000 UT) capsule, Take 5,000 Units by mouth daily. (Patient not taking: Reported on 08/05/2024), Disp: , Rfl:    Coenzyme Q10 (CO Q-10) 200 MG CAPS, Take 200 mg by mouth daily., Disp: , Rfl:    dapagliflozin  propanediol (FARXIGA) 10 MG TABS tablet, Take by mouth daily. (Patient not taking: Reported on 08/05/2024), Disp: , Rfl:    diltiazem  (CARDIZEM  CD) 120 MG 24 hr capsule, Take 120 mg by mouth daily. (Patient not taking: Reported on 08/05/2024), Disp: , Rfl:    escitalopram (LEXAPRO) 20 MG tablet, Take 20 mg by mouth daily., Disp: , Rfl:    Glucose 4-6 GM-MG CHEW, Chew 16 g by mouth daily as needed (Low glucose.)., Disp: , Rfl:    GVOKE HYPOPEN 2-PACK 1 MG/0.2ML SOAJ, Inject 1 mg into the skin as directed., Disp: , Rfl:    linezolid (ZYVOX) 600 MG tablet, Take 600 mg by mouth 2 (two) times daily., Disp: , Rfl:    magnesium oxide (MAG-OX) 400 (240 Mg) MG tablet, Take 1 tablet by mouth 2 (two) times daily., Disp: , Rfl:    melatonin 3 MG TABS tablet, Take 9 mg by mouth at bedtime as needed (sleep)., Disp: , Rfl:    mycophenolate (CELLCEPT) 250 MG capsule, Take 1,000 mg by mouth 2 (two) times daily., Disp: , Rfl:    NOVOLIN N FLEXPEN 100 UNIT/ML FlexPen, Inject 18 Units into the skin as directed., Disp: , Rfl:    NOVOLIN R FLEXPEN RELION 100 UNIT/ML FlexPen, Inject 0-65 Units into the skin as directed., Disp: , Rfl:  omeprazole (PRILOSEC OTC) 20 MG tablet, Take 20 mg by mouth daily as needed (acid reflux)., Disp: , Rfl:    oxyCODONE (OXY IR/ROXICODONE) 5 MG immediate release tablet, Take 5 mg by mouth every 6 (six) hours as needed for severe pain (pain score 7-10)., Disp: , Rfl:    pantoprazole  (PROTONIX ) 40 MG tablet, Take 40 mg by mouth daily., Disp: , Rfl:    predniSONE (DELTASONE) 5 MG tablet, Take 12.5 mg by mouth daily. Take 2.5 Tablets daily., Disp: , Rfl:    PRESCRIPTION MEDICATION, Apply 2 Pump topically See admin instructions. Micronized testosterone 5% gel apply 2 pumps on each arm once daily, Disp: , Rfl:    QUEtiapine (SEROQUEL) 50 MG tablet, Take 50 mg by mouth at bedtime., Disp: , Rfl:    rosuvastatin (CRESTOR) 10 MG tablet, Take 10 mg by mouth daily., Disp: , Rfl:    rosuvastatin  (CRESTOR) 10 MG tablet, Take 10 mg by mouth daily., Disp: , Rfl:    senna-docusate (SENOKOT-S) 8.6-50 MG tablet, Take 2 tablets by mouth at bedtime as needed (BM)., Disp: , Rfl:    tacrolimus (PROGRAF) 1 MG capsule, Take 13 capsules by mouth as directed. Take 6 capsules every morning and 7 capsules at bedtime., Disp: , Rfl:    tamsulosin (FLOMAX) 0.4 MG CAPS capsule, Take 0.4 mg by mouth daily., Disp: , Rfl:    torsemide (DEMADEX) 20 MG tablet, Take 40 mg by mouth daily. 2 Tablets daily., Disp: , Rfl:    traZODone (DESYREL) 150 MG tablet, Take 150 mg by mouth at bedtime., Disp: , Rfl:    valGANciclovir (VALCYTE) 450 MG tablet, Take 900 mg by mouth daily. 2 tablets with dinner., Disp: , Rfl:  [2]  Social History Tobacco Use  Smoking Status Never  Smokeless Tobacco Never

## 2024-09-02 ENCOUNTER — Encounter (HOSPITAL_COMMUNITY)
Admission: RE | Admit: 2024-09-02 | Discharge: 2024-09-02 | Disposition: A | Discharge status: Home / Self Care | Source: Ambulatory Visit | Attending: Surgery | Admitting: Surgery

## 2024-09-02 DIAGNOSIS — Z941 Heart transplant status: Secondary | ICD-10-CM | POA: Diagnosis not present

## 2024-09-02 NOTE — Progress Notes (Signed)
 Daily Session Note  Patient Details  Name: Casey Holland MRN: [id number] Date of Birth: 05-04-1971 Referring Provider:   Flowsheet Row CARDIAC REHAB PHASE II ORIENTATION from 08/03/2024 in [facility] CARDIAC REHABILITATION  Referring Provider Arnie Cadet MD  [facility] Cardiologist: Dr. Nanci Patel]    Encounter Date: 09/02/2024  Check In:  Session Check In - 09/02/24 0920       Check-In   Supervising physician immediately available to respond to emergencies See telemetry face sheet for immediately available MD    Location [facility]    Staff Present Powell Benders, BS, Exercise Physiologist;Brittany Jackquline, BSN, RN, Rosalba Gelineau, MA, RCEP, CCRP, CCET    Virtual Visit No    Medication changes reported     No    Fall or balance concerns reported    No    Tobacco Cessation No Change    Warm-up and Cool-down Performed on first and last piece of equipment    Resistance Training Performed Yes    VAD Patient? No    PAD/SET Patient? No      Pain Assessment   Currently in Pain? No/denies    Multiple Pain Sites No          Capillary Blood Glucose: No results found for this or any previous visit (from the past 24 hours).    Tobacco Use History[1]  Goals Met:  Independence with exercise equipment Exercise tolerated well No report of concerns or symptoms today Strength training completed today  Goals Unmet:  Not Applicable  Comments: Pt able to follow exercise prescription today without complaint.  Will continue to monitor for progression.        [1]  Social History Tobacco Use  Smoking Status Never  Smokeless Tobacco Never

## 2024-09-04 ENCOUNTER — Encounter (HOSPITAL_COMMUNITY)
Admission: RE | Admit: 2024-09-04 | Discharge: 2024-09-04 | Disposition: A | Discharge status: Home / Self Care | Source: Ambulatory Visit | Attending: Surgery | Admitting: Surgery

## 2024-09-04 DIAGNOSIS — Z941 Heart transplant status: Secondary | ICD-10-CM | POA: Diagnosis present

## 2024-09-04 NOTE — Progress Notes (Signed)
 Daily Session Note  Patient Details  Name: KONNAR BEN MRN: [id number] Date of Birth: 01/16/1971 Referring Provider:   Flowsheet Row CARDIAC REHAB PHASE II ORIENTATION from 08/03/2024 in [facility] CARDIAC REHABILITATION  Referring Provider Arnie Cadet MD  [facility] Cardiologist: Dr. Nanci Patel]    Encounter Date: 09/04/2024  Check In:  Session Check In - 09/04/24 0920       Check-In   Supervising physician immediately available to respond to emergencies See telemetry face sheet for immediately available MD    Location [facility]    Staff Present Laymon Rattler, BSN, RN, WTA-C;Heather Con, BS, Exercise Physiologist    Virtual Visit No    Medication changes reported     No    Fall or balance concerns reported    No    Tobacco Cessation No Change    Warm-up and Cool-down Performed on first and last piece of equipment    Resistance Training Performed Yes    VAD Patient? No    PAD/SET Patient? No      Pain Assessment   Currently in Pain? No/denies          Capillary Blood Glucose: No results found for this or any previous visit (from the past 24 hours).    Tobacco Use History[1]  Goals Met:  Independence with exercise equipment Exercise tolerated well No report of concerns or symptoms today Strength training completed today  Goals Unmet:  Not Applicable  Comments: Pt able to follow exercise prescription today without complaint.  Will continue to monitor for progression.        [1]  Social History Tobacco Use  Smoking Status Never  Smokeless Tobacco Never

## 2024-09-07 ENCOUNTER — Encounter (HOSPITAL_COMMUNITY)
Admission: RE | Admit: 2024-09-07 | Discharge: 2024-09-07 | Disposition: A | Discharge status: Home / Self Care | Source: Ambulatory Visit | Attending: Surgery | Admitting: Surgery

## 2024-09-07 DIAGNOSIS — Z941 Heart transplant status: Secondary | ICD-10-CM

## 2024-09-07 NOTE — Progress Notes (Signed)
 Daily Session Note  Patient Details  Name: Casey Holland MRN: [id number] Date of Birth: 08-Jan-1971 Referring Provider:   Flowsheet Row CARDIAC REHAB PHASE II ORIENTATION from 08/03/2024 in [facility] CARDIAC REHABILITATION  Referring Provider Arnie Cadet MD  [facility] Cardiologist: Dr. Nanci Patel]    Encounter Date: 09/07/2024  Check In:  Session Check In - 09/07/24 0900       Check-In   Supervising physician immediately available to respond to emergencies See telemetry face sheet for immediately available MD    Location [facility]    Staff Present Laymon Rattler, BSN, RN, WTA-C;Heather Con, BS, Exercise Physiologist;Jessica Vonzell, MA, RCEP, CCRP, CCET    Virtual Visit No    Medication changes reported     No    Fall or balance concerns reported    No    Tobacco Cessation No Change    Warm-up and Cool-down Performed on first and last piece of equipment    Resistance Training Performed Yes    VAD Patient? No    PAD/SET Patient? No      Pain Assessment   Currently in Pain? No/denies          Capillary Blood Glucose: No results found for this or any previous visit (from the past 24 hours).    Tobacco Use History[1]  Goals Met:  Independence with exercise equipment Exercise tolerated well No report of concerns or symptoms today Strength training completed today  Goals Unmet:  Not Applicable  Comments: Pt able to follow exercise prescription today without complaint.  Will continue to monitor for progression.        [1]  Social History Tobacco Use  Smoking Status Never  Smokeless Tobacco Never

## 2024-09-09 ENCOUNTER — Encounter (HOSPITAL_COMMUNITY)

## 2024-09-09 ENCOUNTER — Encounter (HOSPITAL_COMMUNITY): Payer: Self-pay

## 2024-09-11 ENCOUNTER — Encounter (HOSPITAL_COMMUNITY)
Admission: RE | Admit: 2024-09-11 | Discharge: 2024-09-11 | Disposition: A | Source: Ambulatory Visit | Attending: Surgery | Admitting: Surgery

## 2024-09-11 DIAGNOSIS — Z941 Heart transplant status: Secondary | ICD-10-CM

## 2024-09-11 NOTE — Progress Notes (Signed)
" ° °  Reviewed home exercise with pt today.  Pt plans to walk at home and use staff videos for chair exercises for exercise.  He also has a treadmill at home.  Reviewed THR, pulse, RPE, sign and symptoms, pulse oximetery and when to call 911 or MD.  Also discussed weather considerations and indoor options.  Pt voiced understanding.  "

## 2024-09-11 NOTE — Progress Notes (Signed)
 Daily Session Note  Patient Details  Name: JAICEON COLLISTER MRN: [id number] Date of Birth: 1971-02-18 Referring Provider:   Flowsheet Row CARDIAC REHAB PHASE II ORIENTATION from 08/03/2024 in [facility] CARDIAC REHABILITATION  Referring Provider Arnie Cadet MD  [facility] Cardiologist: Dr. Nanci Patel]    Encounter Date: 09/11/2024  Check In:  Session Check In - 09/11/24 0920       Check-In   Supervising physician immediately available to respond to emergencies See telemetry face sheet for immediately available MD    Location [facility]    Staff Present Powell Benders, BS, Exercise Physiologist;Jessica Vonzell, MA, RCEP, CCRP, CCET    Virtual Visit No    Medication changes reported     No    Fall or balance concerns reported    No    Tobacco Cessation No Change    Warm-up and Cool-down Performed on first and last piece of equipment    Resistance Training Performed Yes    VAD Patient? No    PAD/SET Patient? No      Pain Assessment   Currently in Pain? No/denies    Multiple Pain Sites No          Capillary Blood Glucose: No results found for this or any previous visit (from the past 24 hours).    Tobacco Use History[1]  Goals Met:  Independence with exercise equipment Exercise tolerated well No report of concerns or symptoms today Strength training completed today  Goals Unmet:  Not Applicable  Comments: Pt able to follow exercise prescription today without complaint.  Will continue to monitor for progression.        [1]  Social History Tobacco Use  Smoking Status Never  Smokeless Tobacco Never

## 2024-09-14 ENCOUNTER — Encounter (HOSPITAL_COMMUNITY)
Admission: RE | Admit: 2024-09-14 | Discharge: 2024-09-14 | Disposition: A | Source: Ambulatory Visit | Attending: Surgery | Admitting: Surgery

## 2024-09-14 DIAGNOSIS — Z941 Heart transplant status: Secondary | ICD-10-CM

## 2024-09-14 NOTE — Progress Notes (Signed)
 Daily Session Note  Patient Details  Name: Casey Holland MRN: [id number] Date of Birth: 08/10/1971 Referring Provider:   Flowsheet Row CARDIAC REHAB PHASE II ORIENTATION from 08/03/2024 in [facility] CARDIAC REHABILITATION  Referring Provider Arnie Cadet MD  [facility] Cardiologist: Dr. Nanci Patel]    Encounter Date: 09/14/2024  Check In:  Session Check In - 09/14/24 0925       Check-In   Supervising physician immediately available to respond to emergencies See telemetry face sheet for immediately available MD    Location [facility]    Staff Present Powell Benders, BS, Exercise Physiologist;Brittany Jackquline, BSN, RN, Rosalba Gelineau, MA, RCEP, CCRP, CCET    Virtual Visit No    Medication changes reported     No    Fall or balance concerns reported    No    Tobacco Cessation No Change    Warm-up and Cool-down Performed on first and last piece of equipment    Resistance Training Performed Yes    VAD Patient? No    PAD/SET Patient? No      Pain Assessment   Currently in Pain? No/denies    Multiple Pain Sites No          Capillary Blood Glucose: No results found for this or any previous visit (from the past 24 hours).    Tobacco Use History[1]  Goals Met:  Independence with exercise equipment Exercise tolerated well No report of concerns or symptoms today Strength training completed today  Goals Unmet:  Not Applicable  Comments: Pt able to follow exercise prescription today without complaint.  Will continue to monitor for progression.        [1]  Social History Tobacco Use  Smoking Status Never  Smokeless Tobacco Never

## 2024-09-16 ENCOUNTER — Encounter (HOSPITAL_COMMUNITY)
Admission: RE | Admit: 2024-09-16 | Discharge: 2024-09-16 | Disposition: A | Discharge status: Home / Self Care | Source: Ambulatory Visit | Attending: Surgery | Admitting: Surgery

## 2024-09-16 DIAGNOSIS — Z941 Heart transplant status: Secondary | ICD-10-CM | POA: Diagnosis not present

## 2024-09-16 NOTE — Progress Notes (Signed)
 Daily Session Note  Patient Details  Name: Casey Holland MRN: [id number] Date of Birth: 1970-09-17 Referring Provider:   Flowsheet Row CARDIAC REHAB PHASE II ORIENTATION from 08/03/2024 in [facility] CARDIAC REHABILITATION  Referring Provider Arnie Cadet MD  [facility] Cardiologist: Dr. Nanci Patel]    Encounter Date: 09/16/2024  Check In:  Session Check In - 09/16/24 0915       Check-In   Supervising physician immediately available to respond to emergencies See telemetry face sheet for immediately available MD    Location [facility]    Staff Present Adrien Louder, RN, BSN;Heather Con, BS, Exercise Physiologist;Brittany Jackquline, BSN, RN, WTA-C    Virtual Visit No    Medication changes reported     No    Fall or balance concerns reported    No    Warm-up and Cool-down Performed on first and last piece of equipment    Resistance Training Performed Yes    VAD Patient? No    PAD/SET Patient? No      Pain Assessment   Currently in Pain? No/denies    Multiple Pain Sites No          Capillary Blood Glucose: No results found for this or any previous visit (from the past 24 hours).    Tobacco Use History[1]  Goals Met:  Independence with exercise equipment Exercise tolerated well No report of concerns or symptoms today Strength training completed today  Goals Unmet:  Not Applicable  Comments: Pt able to follow exercise prescription today without complaint.  Will continue to monitor for progression.        [1]  Social History Tobacco Use  Smoking Status Never  Smokeless Tobacco Never

## 2024-09-18 ENCOUNTER — Encounter (HOSPITAL_COMMUNITY)
Admission: RE | Admit: 2024-09-18 | Discharge: 2024-09-18 | Disposition: A | Discharge status: Home / Self Care | Source: Ambulatory Visit | Attending: Surgery | Admitting: Surgery

## 2024-09-18 DIAGNOSIS — Z941 Heart transplant status: Secondary | ICD-10-CM | POA: Diagnosis not present

## 2024-09-18 NOTE — Progress Notes (Signed)
 Daily Session Note  Patient Details  Name: Casey Holland MRN: [id number] Date of Birth: July 10, 1971 Referring Provider:   Flowsheet Row CARDIAC REHAB PHASE II ORIENTATION from 08/03/2024 in [facility] CARDIAC REHABILITATION  Referring Provider Arnie Cadet MD  [facility] Cardiologist: Dr. Nanci Patel]    Encounter Date: 09/18/2024  Check In:  Session Check In - 09/18/24 1105       Check-In   Supervising physician immediately available to respond to emergencies See telemetry face sheet for immediately available MD    Location [facility]    Staff Present Richerd Buddle, RN;Jessica Canton, MA, RCEP, CCRP, CCET    Virtual Visit No    Medication changes reported     No    Fall or balance concerns reported    No    Warm-up and Cool-down Performed on first and last piece of equipment    Resistance Training Performed Yes    VAD Patient? No    PAD/SET Patient? No      Pain Assessment   Currently in Pain? No/denies          Capillary Blood Glucose: No results found for this or any previous visit (from the past 24 hours).    Tobacco Use History[1]  Goals Met:  Independence with exercise equipment Exercise tolerated well No report of concerns or symptoms today Strength training completed today  Goals Unmet:  Not Applicable  Comments: Pt able to follow exercise prescription today without complaint.  Will continue to monitor for progression.        [1]  Social History Tobacco Use  Smoking Status Never  Smokeless Tobacco Never

## 2024-09-21 ENCOUNTER — Encounter (HOSPITAL_COMMUNITY)

## 2024-09-23 ENCOUNTER — Encounter (HOSPITAL_COMMUNITY)
Admission: RE | Admit: 2024-09-23 | Discharge: 2024-09-23 | Disposition: A | Source: Ambulatory Visit | Attending: Surgery | Admitting: Surgery

## 2024-09-23 DIAGNOSIS — Z941 Heart transplant status: Secondary | ICD-10-CM | POA: Diagnosis not present

## 2024-09-23 NOTE — Progress Notes (Signed)
 Daily Session Note  Patient Details  Name: REUBEN KNOBLOCK MRN: [id number] Date of Birth: 1971/06/02 Referring Provider:   Flowsheet Row CARDIAC REHAB PHASE II ORIENTATION from 08/03/2024 in [facility] CARDIAC REHABILITATION  Referring Provider Arnie Cadet MD  [facility] Cardiologist: Dr. Nanci Patel]    Encounter Date: 09/23/2024  Check In:  Session Check In - 09/23/24 0936       Check-In   Supervising physician immediately available to respond to emergencies See telemetry face sheet for immediately available MD    Location [facility]    Staff Present Powell Benders, BS, Exercise Physiologist;Hillary Dean BSN, RN    Virtual Visit No    Medication changes reported     No    Fall or balance concerns reported    No    Tobacco Cessation No Change    Warm-up and Cool-down Performed on first and last piece of equipment    Resistance Training Performed Yes    VAD Patient? No    PAD/SET Patient? No      Pain Assessment   Currently in Pain? No/denies    Multiple Pain Sites No          Capillary Blood Glucose: No results found for this or any previous visit (from the past 24 hours).    Tobacco Use History[1]  Goals Met:  Independence with exercise equipment Exercise tolerated well No report of concerns or symptoms today Strength training completed today  Goals Unmet:  Not Applicable  Comments: Pt able to follow exercise prescription today without complaint.  Will continue to monitor for progression.        [1]  Social History Tobacco Use  Smoking Status Never  Smokeless Tobacco Never

## 2024-09-25 ENCOUNTER — Encounter (HOSPITAL_COMMUNITY)
Admission: RE | Admit: 2024-09-25 | Discharge: 2024-09-25 | Disposition: A | Source: Ambulatory Visit | Attending: Surgery | Admitting: Surgery

## 2024-09-25 DIAGNOSIS — Z941 Heart transplant status: Secondary | ICD-10-CM

## 2024-09-25 NOTE — Progress Notes (Signed)
 Daily Session Note  Patient Details  Name: Casey Holland MRN: [id number] Date of Birth: 1971-05-19 Referring Provider:   Flowsheet Row CARDIAC REHAB PHASE II ORIENTATION from 08/03/2024 in [facility] CARDIAC REHABILITATION  Referring Provider Arnie Cadet MD  [facility] Cardiologist: Dr. Nanci Patel]    Encounter Date: 09/25/2024  Check In:  Session Check In - 09/25/24 0905       Check-In   Supervising physician immediately available to respond to emergencies See telemetry face sheet for immediately available MD    Location [facility]    Staff Present Laymon Rattler, BSN, RN, WTA-C;Heather Con, BS, Exercise Physiologist    Virtual Visit No    Medication changes reported     No    Fall or balance concerns reported    No    Tobacco Cessation No Change    Warm-up and Cool-down Performed on first and last piece of equipment    Resistance Training Performed Yes    VAD Patient? No    PAD/SET Patient? No      Pain Assessment   Currently in Pain? No/denies          Capillary Blood Glucose: No results found for this or any previous visit (from the past 24 hours).    Tobacco Use History[1]  Goals Met:  Independence with exercise equipment Exercise tolerated well No report of concerns or symptoms today Strength training completed today  Goals Unmet:  Not Applicable  Comments: Pt able to follow exercise prescription today without complaint.  Will continue to monitor for progression.        [1]  Social History Tobacco Use  Smoking Status Never  Smokeless Tobacco Never

## 2024-09-28 ENCOUNTER — Encounter (HOSPITAL_COMMUNITY)

## 2024-09-29 ENCOUNTER — Encounter (HOSPITAL_COMMUNITY): Payer: Self-pay | Admitting: *Deleted

## 2024-09-29 DIAGNOSIS — Z941 Heart transplant status: Secondary | ICD-10-CM

## 2024-09-29 NOTE — Progress Notes (Signed)
 Cardiac Individual Treatment Plan  Patient Details  Name: Casey Holland MRN: [id number] Date of Birth: Oct 03, 1970 Referring Provider:   Flowsheet Row CARDIAC REHAB PHASE II ORIENTATION from 08/03/2024 in [facility] CARDIAC REHABILITATION  Referring Provider Arnie Cadet MD  [Primary Cardiologist: Dr. Nanci Patel]    Initial Encounter Date:  Flowsheet Row CARDIAC REHAB PHASE II ORIENTATION from 08/03/2024 in [place] CARDIAC REHABILITATION  Date 08/03/24    Visit Diagnosis: Status post heart transplant [facility])  Patient's Home Medications on Admission: Current Medications[1]  Past Medical History: Past Medical History:  Diagnosis Date   A-fib [facility])    s/p sucessful ablation   Asthma    Cardiomyopathy, hypertrophic nonobstructive (HCC)    subaortic valve gradient of with recent congestive heart failure and volume overload, now improved with diuretics   Heart failure 10/2015   Class III/IV pre 2010 surgery > postop has no CHF   ICD (implantable cardioverter-defibrillator) in place    Wears glasses     Tobacco Use: Tobacco Use History[2]  Labs: Review Flowsheet  More data may exist      Latest Ref Rng & Units 07/17/2013 09/01/2014 02/23/2015 09/23/2015 09/26/2015  Labs for ITP Cardiac and Pulmonary Rehab  Cholestrol 100 - 199 mg/dL [phone]  813  822  821  -  LDL (calc) 0 - 99 mg/dL 882  882  888  889  -  HDL-C >39 mg/dL 35  31  34  34  -  Trlycerides 0 - 149 mg/dL 842  810  840  827  -  Hemoglobin A1c - - - - - 5.2     Capillary Blood Glucose: Lab Results  Component Value Date   GLUCAP 244 (H) 08/05/2024   GLUCAP 231 (H) 08/05/2024   GLUCAP 279 (H) 08/03/2024     Exercise Target Goals: Exercise Program Goal: Individual exercise prescription set using results from initial 6 min walk test and THRR while considering  patients activity barriers and safety.   Exercise Prescription Goal: Starting with aerobic activity 30 plus minutes a day, 3 days per week for  initial exercise prescription. Provide home exercise prescription and guidelines that participant acknowledges understanding prior to discharge.  Activity Barriers & Risk Stratification:  Activity Barriers & Cardiac Risk Stratification - 08/03/24 1341       Activity Barriers & Cardiac Risk Stratification   Activity Barriers Shortness of Breath;Assistive Device;Balance Concerns;Deconditioning;Muscular Weakness   Uses rollator.   Cardiac Risk Stratification High          6 Minute Walk:  6 Minute Walk     Row Name 08/03/24 1529         6 Minute Walk   Phase Initial     Distance 700 feet     Walk Time 6 minutes     # of Rest Breaks 0     MPH 1.33     METS 3.17     RPE 14     Perceived Dyspnea  1     VO2 Peak 11.01     Symptoms Yes (comment)     Comments slightly SOB at end     Resting HR 115 bpm     Resting BP 110/70     Resting Oxygen Saturation  99 %     Exercise Oxygen Saturation  during 6 min walk 96 %     Max Ex. HR 137 bpm     Max Ex. BP 164/62     2 Minute Post  BP 128/72        Oxygen Initial Assessment:   Oxygen Re-Evaluation:   Oxygen Discharge (Final Oxygen Re-Evaluation):   Initial Exercise Prescription:  Initial Exercise Prescription - 08/03/24 1500       Date of Initial Exercise RX and Referring Provider   Date 08/03/24    Referring Provider Arnie Cadet MD   Primary Cardiologist: Dr. Nanci Blanch     Oxygen   Maintain Oxygen Saturation 88% or higher      Treadmill   MPH 1.3    Grade 1    Minutes 15    METs 2.17      REL-XR   Level 3    Speed 50    Minutes 15    METs 3      Prescription Details   Frequency (times per week) 3    Duration Progress to 30 minutes of continuous aerobic without signs/symptoms of physical distress      Intensity   THRR 40-80% of Max Heartrate 136-157    Ratings of Perceived Exertion 11-13    Perceived Dyspnea 0-4      Progression   Progression Continue to progress workloads to maintain  intensity without signs/symptoms of physical distress.      Resistance Training   Training Prescription Yes    Weight 4 lb    Reps 10-15          Perform Capillary Blood Glucose checks as needed.  Exercise Prescription Changes:   Exercise Prescription Changes     Row Name 08/03/24 1500 08/18/24 0800 09/07/24 1500 09/11/24 0900       Response to Exercise   Blood Pressure (Admit) 110/70 130/66 126/66 --    Blood Pressure (Exercise) 164/62 144/72 -- --    Blood Pressure (Exit) 128/72 120/90 126/62 --    Heart Rate (Admit) 115 bpm 92 bpm 98 bpm --    Heart Rate (Exercise) 137 bpm 129 bpm 127 bpm --    Heart Rate (Exit) 128 bpm 116 bpm 118 bpm --    Oxygen Saturation (Admit) 99 % -- -- --    Oxygen Saturation (Exercise) 96 % -- -- --    Rating of Perceived Exertion (Exercise) 14 13 13  --    Perceived Dyspnea (Exercise) 1 -- -- --    Symptoms slightly SOB -- -- --    Comments walk test results -- -- --    Duration -- Continue with 30 min of aerobic exercise without signs/symptoms of physical distress. Continue with 30 min of aerobic exercise without signs/symptoms of physical distress. --    Intensity -- THRR unchanged THRR unchanged --      Progression   Progression -- Continue to progress workloads to maintain intensity without signs/symptoms of physical distress. Continue to progress workloads to maintain intensity without signs/symptoms of physical distress. --      Resistance Training   Weight -- 4 5 --    Reps -- 10-15 10-15 --      Treadmill   MPH -- 1.8 1.8 --    Grade -- 0 1.5 --    Minutes -- 15 15 --    METs -- 2.38 2.75 --      REL-XR   Level -- 2 4 --    Speed -- 50 57 --    Minutes -- 15 15 --    METs -- 3.7 3.5 --      Home Exercise Plan   Plans to continue exercise at -- -- --  Home (comment)  walking, treadmill, chair exercises    Frequency -- -- -- Add 2 additional days to program exercise sessions.    Initial Home Exercises Provided -- -- --  09/11/24       Exercise Comments:   Exercise Goals and Review:   Exercise Goals     Row Name 08/03/24 1533             Exercise Goals   Increase Physical Activity Yes       Intervention Provide advice, education, support and counseling about physical activity/exercise needs.;Develop an individualized exercise prescription for aerobic and resistive training based on initial evaluation findings, risk stratification, comorbidities and participant's personal goals.       Expected Outcomes Short Term: Attend rehab on a regular basis to increase amount of physical activity.;Long Term: Exercising regularly at least 3-5 days a week.;Long Term: Add in home exercise to make exercise part of routine and to increase amount of physical activity.       Increase Strength and Stamina Yes       Intervention Provide advice, education, support and counseling about physical activity/exercise needs.;Develop an individualized exercise prescription for aerobic and resistive training based on initial evaluation findings, risk stratification, comorbidities and participant's personal goals.       Expected Outcomes Short Term: Increase workloads from initial exercise prescription for resistance, speed, and METs.;Short Term: Perform resistance training exercises routinely during rehab and add in resistance training at home;Long Term: Improve cardiorespiratory fitness, muscular endurance and strength as measured by increased METs and functional capacity ( )       Able to understand and use rate of perceived exertion (RPE) scale Yes       Intervention Provide education and explanation on how to use RPE scale       Expected Outcomes Short Term: Able to use RPE daily in rehab to express subjective intensity level;Long Term:  Able to use RPE to guide intensity level when exercising independently       Able to understand and use Dyspnea scale Yes       Intervention Provide education and explanation on how to use  Dyspnea scale       Expected Outcomes Short Term: Able to use Dyspnea scale daily in rehab to express subjective sense of shortness of breath during exertion;Long Term: Able to use Dyspnea scale to guide intensity level when exercising independently       Knowledge and understanding of Target Heart Rate Range (THRR) Yes       Intervention Provide education and explanation of THRR including how the numbers were predicted and where they are located for reference       Expected Outcomes Short Term: Able to state/look up THRR;Long Term: Able to use THRR to govern intensity when exercising independently;Short Term: Able to use daily as guideline for intensity in rehab       Able to check pulse independently Yes       Intervention Provide education and demonstration on how to check pulse in carotid and radial arteries.;Review the importance of being able to check your own pulse for safety during independent exercise       Expected Outcomes Short Term: Able to explain why pulse checking is important during independent exercise;Long Term: Able to check pulse independently and accurately       Understanding of Exercise Prescription Yes       Intervention Provide education, explanation, and written materials on patient's individual exercise prescription  Expected Outcomes Short Term: Able to explain program exercise prescription;Long Term: Able to explain home exercise prescription to exercise independently          Exercise Goals Re-Evaluation :  Exercise Goals Re-Evaluation     Row Name 08/17/24 9061 08/18/24 0835 09/11/24 0934         Exercise Goal Re-Evaluation   Exercise Goals Review Increase Physical Activity;Increase Strength and Stamina;Understanding of Exercise Prescription Increase Physical Activity;Increase Strength and Stamina;Understanding of Exercise Prescription Increase Physical Activity;Increase Strength and Stamina;Able to understand and use Dyspnea scale;Able to check pulse  independently;Knowledge and understanding of Target Heart Rate Range (THRR);Able to understand and use rate of perceived exertion (RPE) scale;Understanding of Exercise Prescription     Comments Casey Holland has been doing well in rehab. He has noticed slight increase in his stamini since coming to rehab. He still feels tired after but this will increrase over time. He is no longer having to use his rollator to help walk. He is not really exercsiing outside of rehab but has been doing actiivites such as going to the store and to family so this does make him tired at the end of the day. Casey Holland has completed 5 sessions of CR. He is doing well and seeing slight improvment. He is walking well now on the treadmill. His level on the XR is now at 2. Will continue to monitor and progress asable Casey Holland is doing well in rehab.  He is making progress and grateful to be here as it makes him move more.  He says at home, he tends to not be as active.  We talked about why he needs to keep moving.  Reviewed home exercise with pt today.  Pt plans to walk at home and use staff videos for chair exercises for exercise.  He also has a treadmill at home.  Reviewed THR, pulse, RPE, sign and symptoms, pulse oximetery and when to call 911 or MD.  Also discussed weather considerations and indoor options.  Pt voiced understanding.     Expected Outcomes short; add walking to exercise when at home    long:continue to exercise and attend rehab Short: continue to attend rehab   long:add exercise outside rehab Short: Start to add in chair exercise videos at home Long; continue to improve stamina         Discharge Exercise Prescription (Final Exercise Prescription Changes):  Exercise Prescription Changes - 09/11/24 0900       Home Exercise Plan   Plans to continue exercise at Home (comment)   walking, treadmill, chair exercises   Frequency Add 2 additional days to program exercise sessions.    Initial Home Exercises Provided 09/11/24           Nutrition:  Target Goals: Understanding of nutrition guidelines, daily intake of sodium 1500mg , cholesterol 200mg , calories 30% from fat and 7% or less from saturated fats, daily to have 5 or more servings of fruits and vegetables.  Biometrics:  Pre Biometrics - 08/03/24 1533       Pre Biometrics   Height 6' 1 (1.854 m)    Weight 252 lb 11.2 oz (114.6 kg)    Waist Circumference 44.25 inches    Hip Circumference 45 inches    Waist to Hip Ratio 0.98 %    BMI (Calculated) 33.35    Grip Strength 27.1 kg    Single Leg Stand 19.6 seconds           Nutrition Therapy Plan and Nutrition Goals:  Nutrition Therapy & Goals - 08/03/24 1534       Intervention Plan   Intervention Prescribe, educate and counsel regarding individualized specific dietary modifications aiming towards targeted core components such as weight, hypertension, lipid management, diabetes, heart failure and other comorbidities.;Nutrition handout(s) given to patient.    Expected Outcomes Short Term Goal: Understand basic principles of dietary content, such as calories, fat, sodium, cholesterol and nutrients.;Long Term Goal: Adherence to prescribed nutrition plan.          Nutrition Assessments:  MEDIFICTS Score Key: >=70 Need to make dietary changes  40-70 Heart Healthy Diet <= 40 Therapeutic Level Cholesterol Diet  Flowsheet Row Documentation from 08/07/2024 in [facility] CARDIAC REHABILITATION  Picture Your Plate Total Score on Admission 58   Picture Your Plate Scores: <59 Unhealthy dietary pattern with much room for improvement. 41-50 Dietary pattern unlikely to meet recommendations for good health and room for improvement. 51-60 More healthful dietary pattern, with some room for improvement.  >60 Healthy dietary pattern, although there may be some specific behaviors that could be improved.    Nutrition Goals Re-Evaluation:  Nutrition Goals Re-Evaluation     Row Name 08/17/24 0946 09/11/24 0940            Goals   Nutrition Goal Healthy eating Short: try out different veggies in diet and moderation on sweets long: continue with healthy eating      Comment Chaddrick is doing well in rehab. He stated that he is trying to eat healthier but also does not eat much due to not felling hungery. he stated that he does stay very thristy dueint the day and will drink water and also zero sugar gatorades. we talked about just watching sodium when drinking the gatorades. He is eating mostly chicken in his diet. He is trying to include vveggies but stated that he really isnt a fan of them. He will eat green beans. He is a sweets person and is trying to cut back on the amount of sweets he eats. With the holidays he stated taht it has been hard due to desserts at holiday gatherings. Casey Holland is still not eating a lot.  He still has no appetite.  He has been gaining weight as he drinks whole milk to take his meds.  The thickness helps.  He does use protein shakes to supplement not eating a lot. His wife wants him to eat more.  He was encouraged to eat more varitey even if forcing it.      Expected Outcome Short: try out different veggies in diet and moderation on sweets   long: continue with healthy eating Short: Continue to eat and supplement Long: Heart Healthy eating plan         Nutrition Goals Discharge (Final Nutrition Goals Re-Evaluation):  Nutrition Goals Re-Evaluation - 09/11/24 0940       Goals   Nutrition Goal Short: try out different veggies in diet and moderation on sweets long: continue with healthy eating    Comment Casey Holland is still not eating a lot.  He still has no appetite.  He has been gaining weight as he drinks whole milk to take his meds.  The thickness helps.  He does use protein shakes to supplement not eating a lot. His wife wants him to eat more.  He was encouraged to eat more varitey even if forcing it.    Expected Outcome Short: Continue to eat and supplement Long: Heart Healthy eating plan  Psychosocial: Target Goals: Acknowledge presence or absence of significant depression and/or stress, maximize coping skills, provide positive support system. Participant is able to verbalize types and ability to use techniques and skills needed for reducing stress and depression.  Initial Review & Psychosocial Screening:  Initial Psych Review & Screening - 08/03/24 1428       Initial Review   Current issues with Current Psychotropic Meds;Current Anxiety/Panic;Current Sleep Concerns;Current Depression      Family Dynamics   Good Support System? Yes    Comments Patient's wife, mother, aunt and uncle support him.      Barriers   Psychosocial barriers to participate in program The patient should benefit from training in stress management and relaxation.;There are no identifiable barriers or psychosocial needs.      Screening Interventions   Interventions Encouraged to exercise;To provide support and resources with identified psychosocial needs;Provide feedback about the scores to participant    Expected Outcomes Short Term goal: Utilizing psychosocial counselor, staff and physician to assist with identification of specific Stressors or current issues interfering with healing process. Setting desired goal for each stressor or current issue identified.;Long Term Goal: Stressors or current issues are controlled or eliminated.;Short Term goal: Identification and review with participant of any Quality of Life or Depression concerns found by scoring the questionnaire.;Long Term goal: The participant improves quality of Life and PHQ9 Scores as seen by post scores and/or verbalization of changes          Quality of Life Scores:  Quality of Life - 08/07/24 0822       Quality of Life   Select Quality of Life      Quality of Life Scores   Health/Function Pre 18.43 %    Socioeconomic Pre 19.44 %    Psych/Spiritual Pre 22.36 %    Family Pre 27.6 %    GLOBAL Pre 20.76 %          Scores of 19 and below usually indicate a poorer quality of life in these areas.  A difference of  2-3 points is a clinically meaningful difference.  A difference of 2-3 points in the total score of the Quality of Life Index has been associated with significant improvement in overall quality of life, self-image, physical symptoms, and general health in studies assessing change in quality of life.  PHQ-9: Review Flowsheet       08/03/2024  Depression screen PHQ 2/9  Decreased Interest 1  Down, Depressed, Hopeless 1  PHQ - 2 Score 2  Altered sleeping 1  Tired, decreased energy 1  Change in appetite 1  Feeling bad or failure about yourself  1  Trouble concentrating 1  Moving slowly or fidgety/restless 1  Suicidal thoughts 0  PHQ-9 Score 8  Difficult doing work/chores Very difficult   Interpretation of Total Score  Total Score Depression Severity:  1-4 = Minimal depression, 5-9 = Mild depression, 10-14 = Moderate depression, 15-19 = Moderately severe depression, 20-27 = Severe depression   Psychosocial Evaluation and Intervention:  Psychosocial Evaluation - 08/03/24 1441       Psychosocial Evaluation & Interventions   Interventions Stress management education;Relaxation education;Encouraged to exercise with the program and follow exercise prescription    Comments Patient was referred to cardiac rehab with heart transplant 10/25. He has had heart issues long term and did CR in 2015 after having a myectomy. He is an optician, dispensing at [employer]. He has not returned to work and is not sure he will  due to being around children and his suppressed immunity. He hopes to find something within the school system to maybe work from home or not be around kids so much. He is currently being treated for both depression and anxiety with Lexapro. He feels his anxiety has worsned since his surgery but it is improving. He says he has trouble falling asleep. His PCP is tying melotonin. He  says he has not seen a big difference with the medication. His goals for the program are to get back to normal; improve his strength and stamina and be able to return to work. He has no barriers identified to complete the program.    Expected Outcomes Short Term: Patient will start the program and attend consistently. Long Term: Patient will complete the program meeting personal goals.    Continue Psychosocial Services  Follow up required by staff          Psychosocial Re-Evaluation:  Psychosocial Re-Evaluation     Row Name 08/17/24 [phone] 09/11/24 0935           Psychosocial Re-Evaluation   Current issues with None Identified Current Psychotropic Meds;Current Stress Concerns      Comments Casey Holland is doing well in rehab. he stated that he really does not have any sleep issues. He does get up about twice a night to use the restroom but has no issues with going back to sleep. He also stated that he does not have any main stressors in his life other then normal everyday stress. He is postitive person and wants to go back to work but knows itll have to be in a different job due to not wanting to be in schools due to all the germs Casey Holland had a heart transplant f/u visit yesterday that went well.  They did biopsy to test for rejection.  They adjusted several meds down including sleep and mental health meds.  He just moved his son to [facility] for a new job with [employer]. He tries to stay positive and denies any other stressors besides his health.  He has been enjoying rehab as it helps get him out and about.      Expected Outcomes Short: continue to exercise to help with stres and sleep   long: continue to have outlet for stress Short: Start to be more active at home Long: Continue to stay positiv e      Interventions Encouraged to attend Cardiac Rehabilitation for the exercise Encouraged to attend Cardiac Rehabilitation for the exercise      Continue Psychosocial Services  Follow up required by staff Follow up  required by staff         Psychosocial Discharge (Final Psychosocial Re-Evaluation):  Psychosocial Re-Evaluation - 09/11/24 0935       Psychosocial Re-Evaluation   Current issues with Current Psychotropic Meds;Current Stress Concerns    Comments Casey Holland had a heart transplant f/u visit yesterday that went well.  They did biopsy to test for rejection.  They adjusted several meds down including sleep and mental health meds.  He just moved his son to [facility] for a new job with [employer]. He tries to stay positive and denies any other stressors besides his health.  He has been enjoying rehab as it helps get him out and about.    Expected Outcomes Short: Start to be more active at home Long: Continue to stay positiv e    Interventions Encouraged to attend Cardiac Rehabilitation for the exercise    Continue Psychosocial Services  Follow  up required by staff          Vocational Rehabilitation: Provide vocational rehab assistance to qualifying candidates.   Vocational Rehab Evaluation & Intervention:  Vocational Rehab - 08/03/24 1355       Initial Vocational Rehab Evaluation & Intervention   Assessment shows need for Vocational Rehabilitation No      Vocational Rehab Re-Evaulation   Comments Patient plans to return to his job.          Education: Education Goals: Education classes will be provided on a weekly basis, covering required topics. Participant will state understanding/return demonstration of topics presented.  Learning Barriers/Preferences:  Learning Barriers/Preferences - 08/03/24 1355       Learning Barriers/Preferences   Learning Barriers None    Learning Preferences Written Material;Skilled Demonstration;Computer/Internet          Education Topics: Hypertension, Hypertension Reduction -Define heart disease and high blood pressure. Discus how high blood pressure affects the body and ways to reduce high blood pressure.   Exercise and Your Heart -Discuss why it is  important to exercise, the FITT principles of exercise, normal and abnormal responses to exercise, and how to exercise safely.   Angina -Discuss definition of angina, causes of angina, treatment of angina, and how to decrease risk of having angina.   Cardiac Medications -Review what the following cardiac medications are used for, how they affect the body, and side effects that may occur when taking the medications.  Medications include Aspirin, Beta blockers, calcium channel blockers, ACE Inhibitors, angiotensin receptor blockers, diuretics, digoxin, and antihyperlipidemics.   Congestive Heart Failure -Discuss the definition of CHF, how to live with CHF, the signs and symptoms of CHF, and how keep track of weight and sodium intake.   Heart Disease and Intimacy -Discus the effect sexual activity has on the heart, how changes occur during intimacy as we age, and safety during sexual activity.   Smoking Cessation / COPD -Discuss different methods to quit smoking, the health benefits of quitting smoking, and the definition of COPD.   Nutrition I: Fats -Discuss the types of cholesterol, what cholesterol does to the heart, and how cholesterol levels can be controlled.   Nutrition II: Labels -Discuss the different components of food labels and how to Holland food label   Heart Parts/Heart Disease and PAD -Discuss the anatomy of the heart, the pathway of blood circulation through the heart, and these are affected by heart disease.   Stress I: Signs and Symptoms -Discuss the causes of stress, how stress may lead to anxiety and depression, and ways to limit stress.   Stress II: Relaxation -Discuss different types of relaxation techniques to limit stress.   Warning Signs of Stroke / TIA -Discuss definition of a stroke, what the signs and symptoms are of a stroke, and how to identify when someone is having stroke.   Knowledge Questionnaire Score:  Knowledge Questionnaire Score -  08/07/24 9177       Knowledge Questionnaire Score   Pre Score 22/26          Core Components/Risk Factors/Patient Goals at Admission:  Personal Goals and Risk Factors at Admission - 08/03/24 1356       Core Components/Risk Factors/Patient Goals on Admission    Weight Management Weight Maintenance;Yes    Intervention Weight Management: Develop a combined nutrition and exercise program designed to reach desired caloric intake, while maintaining appropriate intake of nutrient and fiber, sodium and fats, and appropriate energy expenditure required for the weight goal.;Weight  Management: Provide education and appropriate resources to help participant work on and attain dietary goals.    Admit Weight 252 lb 11.2 oz (114.6 kg)    Goal Weight: Short Term 250 lb (113.4 kg)    Goal Weight: Long Term 250 lb (113.4 kg)    Expected Outcomes Short Term: Continue to assess and modify interventions until short term weight is achieved;Long Term: Adherence to nutrition and physical activity/exercise program aimed toward attainment of established weight goal;Weight Maintenance: Understanding of the daily nutrition guidelines, which includes 25-35% calories from fat, 7% or less cal from saturated fats, less than 200mg  cholesterol, less than 1.5gm of sodium, & 5 or more servings of fruits and vegetables daily    Improve shortness of breath with ADL's Yes    Intervention Provide education, individualized exercise plan and daily activity instruction to help decrease symptoms of SOB with activities of daily living.    Expected Outcomes Short Term: Improve cardiorespiratory fitness to achieve a reduction of symptoms when performing ADLs;Long Term: Be able to perform more ADLs without symptoms or delay the onset of symptoms    Diabetes Yes    Intervention Provide education about signs/symptoms and action to take for hypo/hyperglycemia.;Provide education about proper nutrition, including hydration, and  aerobic/resistive exercise prescription along with prescribed medications to achieve blood glucose in normal ranges: Fasting glucose 65-99 mg/dL    Expected Outcomes Short Term: Participant verbalizes understanding of the signs/symptoms and immediate care of hyper/hypoglycemia, proper foot care and importance of medication, aerobic/resistive exercise and nutrition plan for blood glucose control.;Long Term: Attainment of HbA1C < 7%.    Hypertension Yes    Intervention Provide education on lifestyle modifcations including regular physical activity/exercise, weight management, moderate sodium restriction and increased consumption of fresh fruit, vegetables, and low fat dairy, alcohol moderation, and smoking cessation.;Monitor prescription use compliance.    Expected Outcomes Short Term: Continued assessment and intervention until BP is < 140/68mm HG in hypertensive participants. < 130/55mm HG in hypertensive participants with diabetes, heart failure or chronic kidney disease.;Long Term: Maintenance of blood pressure at goal levels.    Lipids Yes    Intervention Provide education and support for participant on nutrition & aerobic/resistive exercise along with prescribed medications to achieve LDL 70mg , HDL >40mg .    Expected Outcomes Short Term: Participant states understanding of desired cholesterol values and is compliant with medications prescribed. Participant is following exercise prescription and nutrition guidelines.;Long Term: Cholesterol controlled with medications as prescribed, with individualized exercise RX and with personalized nutrition plan. Value goals: LDL < 70mg , HDL > 40 mg.          Core Components/Risk Factors/Patient Goals Review:   Goals and Risk Factor Review     Row Name 08/17/24 0950 09/11/24 0941           Core Components/Risk Factors/Patient Goals Review   Personal Goals Review Weight Management/Obesity;Other Weight Management/Obesity;Hypertension;Improve shortness of  breath with ADL's;Diabetes      Review Casey Holland is doing well in rehab. He has sliggh increase in his stamini and no longer needs the rollator. He is trying to eat helathier and is monitoring his weight for any fluid increase. He is exercise well in class but does not exercise outsde class.He sometimes checks his BP at home but has noticed that his BP in his LUE is good and in BP in RUE is high/low (all over the place). We talked about monitoring the BP and talking to cardio about the difference Casey Holland is doing well in  rehab.  His weigth has come up some as he uses whole milk to take his meds.  He has no appetite still and supplements with protein shakes.  His sugars have been fairly good.  His blood pressures go up and down but he keeps checking them and can tell when they feel off. He is still having some SOB but feels that it has gotten a little better since he started exercising.      Expected Outcomes Short: continue to monitor BP   long: continue to attend rehab and exercise Short: Continue to keep eye on BP Long; Continue to work to improve breathing         Core Components/Risk Factors/Patient Goals at Discharge (Final Review):   Goals and Risk Factor Review - 09/11/24 0941       Core Components/Risk Factors/Patient Goals Review   Personal Goals Review Weight Management/Obesity;Hypertension;Improve shortness of breath with ADL's;Diabetes    Review Casey Holland is doing well in rehab.  His weigth has come up some as he uses whole milk to take his meds.  He has no appetite still and supplements with protein shakes.  His sugars have been fairly good.  His blood pressures go up and down but he keeps checking them and can tell when they feel off. He is still having some SOB but feels that it has gotten a little better since he started exercising.    Expected Outcomes Short: Continue to keep eye on BP Long; Continue to work to improve breathing          ITP Comments:  ITP Comments     Row Name 08/03/24  1501 08/04/24 0920 08/05/24 0943 09/01/24 1248 09/29/24 0959   ITP Comments Patient arrived for 1st visit/orientation/education at 1300. Patient was referred to CR by Dr. Jordan Schroder due to Heart Transplant. During orientation advised patient on arrival and appointment times what to wear, what to do before, during and after exercise. Reviewed attendance and class policy.  Pt is scheduled to return Cardiac Rehab on 08/05/24 at 915. Pt was advised to come to class 15 minutes before class starts.  Discussed RPE/Dpysnea scales. Patient participated in warm up stretches. Patient was able to complete 6 minute walk test.  Telemetry:NSR. Patient was measured for the equipment. Discussed equipment safety with patient. Took patient pre-anthropometric measurements. Patient finished visit at 1445. 30 day review completed. ITP sent to Dr. Dorn Ross, Medical Director of Cardiac Rehab. Continue with ITP unless changes are made by physician.  Only oriented yesterday First full day of exercise!  Patient was oriented to gym and equipment including functions, settings, policies, and procedures.  Patient's individual exercise prescription and treatment plan were reviewed.  All starting workloads were established based on the results of the 6 minute walk test done at initial orientation visit.  The plan for exercise progression was also introduced and progression will be customized based on patient's performance and goals. 30 day review completed. ITP sent to Dr. Dorn Ross, Medical Director of Cardiac Rehab. Continue with ITP unless changes are made by physician. 30 day review completed. ITP sent to Dr. Dorn Ross, Medical Director of Cardiac Rehab. Continue with ITP unless changes are made by physician.      Comments: 30 day review     [1]  Current Outpatient Medications:    Accu-Chek Softclix Lancets lancets, 1 each by Other route as directed., Disp: , Rfl:    acetaminophen  (TYLENOL ) 500 MG tablet, Take  1,000-1,500 mg by mouth  every 6 (six) hours as needed for moderate pain or headache., Disp: , Rfl:    amoxicillin (AMOXIL) 500 MG capsule, Take 2,000 mg by mouth as directed., Disp: , Rfl:    aspirin EC 81 MG tablet, Take 81 mg by mouth daily., Disp: , Rfl:    Cholecalciferol (DIALYVITE VITAMIN D 5000) 125 MCG (5000 UT) capsule, Take 5,000 Units by mouth daily. (Patient not taking: Reported on 08/05/2024), Disp: , Rfl:    Coenzyme Q10 (CO Q-10) 200 MG CAPS, Take 200 mg by mouth daily., Disp: , Rfl:    dapagliflozin propanediol (FARXIGA) 10 MG TABS tablet, Take by mouth daily. (Patient not taking: Reported on 08/05/2024), Disp: , Rfl:    diltiazem  (CARDIZEM  CD) 120 MG 24 hr capsule, Take 120 mg by mouth daily. (Patient not taking: Reported on 08/05/2024), Disp: , Rfl:    escitalopram (LEXAPRO) 20 MG tablet, Take 20 mg by mouth daily., Disp: , Rfl:    Glucose 4-6 GM-MG CHEW, Chew 16 g by mouth daily as needed (Low glucose.)., Disp: , Rfl:    GVOKE HYPOPEN 2-PACK 1 MG/0.2ML SOAJ, Inject 1 mg into the skin as directed., Disp: , Rfl:    linezolid (ZYVOX) 600 MG tablet, Take 600 mg by mouth 2 (two) times daily., Disp: , Rfl:    magnesium oxide (MAG-OX) 400 (240 Mg) MG tablet, Take 1 tablet by mouth 2 (two) times daily., Disp: , Rfl:    melatonin 3 MG TABS tablet, Take 9 mg by mouth at bedtime as needed (sleep)., Disp: , Rfl:    mycophenolate (CELLCEPT) 250 MG capsule, Take 1,000 mg by mouth 2 (two) times daily., Disp: , Rfl:    NOVOLIN N FLEXPEN 100 UNIT/ML FlexPen, Inject 18 Units into the skin as directed., Disp: , Rfl:    NOVOLIN R FLEXPEN RELION 100 UNIT/ML FlexPen, Inject 0-65 Units into the skin as directed., Disp: , Rfl:    omeprazole (PRILOSEC OTC) 20 MG tablet, Take 20 mg by mouth daily as needed (acid reflux)., Disp: , Rfl:    oxyCODONE (OXY IR/ROXICODONE) 5 MG immediate release tablet, Take 5 mg by mouth every 6 (six) hours as needed for severe pain (pain score 7-10)., Disp: , Rfl:     pantoprazole  (PROTONIX ) 40 MG tablet, Take 40 mg by mouth daily., Disp: , Rfl:    predniSONE (DELTASONE) 5 MG tablet, Take 12.5 mg by mouth daily. Take 2.5 Tablets daily., Disp: , Rfl:    PRESCRIPTION MEDICATION, Apply 2 Pump topically See admin instructions. Micronized testosterone 5% gel apply 2 pumps on each arm once daily, Disp: , Rfl:    QUEtiapine (SEROQUEL) 50 MG tablet, Take 50 mg by mouth at bedtime., Disp: , Rfl:    rosuvastatin (CRESTOR) 10 MG tablet, Take 10 mg by mouth daily., Disp: , Rfl:    rosuvastatin (CRESTOR) 10 MG tablet, Take 10 mg by mouth daily., Disp: , Rfl:    senna-docusate (SENOKOT-S) 8.6-50 MG tablet, Take 2 tablets by mouth at bedtime as needed (BM)., Disp: , Rfl:    tacrolimus (PROGRAF) 1 MG capsule, Take 13 capsules by mouth as directed. Take 6 capsules every morning and 7 capsules at bedtime., Disp: , Rfl:    tamsulosin (FLOMAX) 0.4 MG CAPS capsule, Take 0.4 mg by mouth daily., Disp: , Rfl:    torsemide (DEMADEX) 20 MG tablet, Take 40 mg by mouth daily. 2 Tablets daily., Disp: , Rfl:    traZODone (DESYREL) 150 MG tablet, Take 150 mg by mouth at bedtime.,  Disp: , Rfl:    valGANciclovir (VALCYTE) 450 MG tablet, Take 900 mg by mouth daily. 2 tablets with dinner., Disp: , Rfl:  [2]  Social History Tobacco Use  Smoking Status Never  Smokeless Tobacco Never

## 2024-09-30 ENCOUNTER — Encounter (HOSPITAL_COMMUNITY)
Admission: RE | Admit: 2024-09-30 | Discharge: 2024-09-30 | Disposition: A | Source: Ambulatory Visit | Attending: Surgery | Admitting: Surgery

## 2024-09-30 DIAGNOSIS — Z941 Heart transplant status: Secondary | ICD-10-CM

## 2024-09-30 NOTE — Progress Notes (Signed)
 Daily Session Note  Patient Details  Name: Casey Holland MRN: [id number] Date of Birth: Jan 31, 1971 Referring Provider:   Flowsheet Row CARDIAC REHAB PHASE II ORIENTATION from 08/03/2024 in [facility]ARD[facility]IT[facility]  Referring Provider Arnie Cadet MD  [facility] Cardiologist: Dr. Nanci Patel]    Encounter Date: 09/30/2024  Check In:  Session Check In - 09/30/24 0916       Check-In   Supervising physician immediately available to respond to emergencies See telemetry face sheet for immediately available MD    Location [facility]    Staff Present Rolland Sake BSN, RN;Olympia Adelsberger Con, BS, Exercise Physiologist;Brittany Jackquline, BSN, RN, WTA-C;Jessica Vonzell, MA, RCEP, CCRP, CCET    Virtual Visit No    Medication changes reported     No    Fall or balance concerns reported    No    Tobacco Cessation No Change    Warm-up and Cool-down Performed on first and last piece of equipment    Resistance Training Performed Yes    VAD Patient? No    PAD/SET Patient? No      Pain Assessment   Currently in Pain? No/denies    Multiple Pain Sites No          Capillary Blood Glucose: No results found for this or any previous visit (from the past 24 hours).    Tobacco Use History[1]  Goals Met:  Independence with exercise equipment Exercise tolerated well No report of concerns or symptoms today Strength training completed today  Goals Unmet:  Not Applicable  Comments: Pt able to follow exercise prescription today without complaint.  Will continue to monitor for progression.        [1]  Social History Tobacco Use  Smoking Status Never  Smokeless Tobacco Never

## 2024-10-02 ENCOUNTER — Encounter (HOSPITAL_COMMUNITY)
Admission: RE | Admit: 2024-10-02 | Discharge: 2024-10-02 | Disposition: A | Source: Ambulatory Visit | Attending: Surgery | Admitting: Surgery

## 2024-10-02 DIAGNOSIS — Z941 Heart transplant status: Secondary | ICD-10-CM

## 2024-10-02 NOTE — Progress Notes (Signed)
 Daily Session Note  Patient Details  Name: Casey Holland MRN: [id number] Date of Birth: [birth year] Referring Provider:   Flowsheet Row CARDIAC REHAB PHASE II ORIENTATION from 08/03/2024 in [facility] CARDIAC REHABILITATION  Referring Provider Arnie Cadet MD  [facility] Cardiologist: Dr. Nanci Patel]    Encounter Date: 10/02/2024  Check In:  Session Check In - 10/02/24 0904       Check-In   Supervising physician immediately available to respond to emergencies See telemetry face sheet for immediately available MD    Location [facility]    Staff Present Powell Benders, BS, Exercise Physiologist;Jahred Tatar Jackquline, BSN, RN, WTA-C;Victoria Zina, RN    Virtual Visit No    Medication changes reported     No    Fall or balance concerns reported    No    Tobacco Cessation No Change    Warm-up and Cool-down Performed on first and last piece of equipment    Resistance Training Performed Yes    VAD Patient? No    PAD/SET Patient? No      Pain Assessment   Currently in Pain? No/denies          Capillary Blood Glucose: No results found for this or any previous visit (from the past 24 hours).    Tobacco Use History[1]  Goals Met:  Independence with exercise equipment Exercise tolerated well No report of concerns or symptoms today Strength training completed today  Goals Unmet:  Not Applicable  Comments: Pt able to follow exercise prescription today without complaint.  Will continue to monitor for progression.        [1]  Social History Tobacco Use  Smoking Status Never  Smokeless Tobacco Never

## 2024-10-05 ENCOUNTER — Encounter (HOSPITAL_COMMUNITY)

## 2024-10-07 ENCOUNTER — Encounter (HOSPITAL_COMMUNITY)

## 2024-10-07 ENCOUNTER — Telehealth (HOSPITAL_COMMUNITY): Payer: Self-pay | Admitting: Surgery

## 2024-10-07 NOTE — Telephone Encounter (Signed)
 I called the pt since he missed his cardiac rehab session today.  The pt stated that he has Covid.  Per our protocol, the pt needs to be out for 10 days from when he tested positive.  The pt tested positive on Sunday, so his first day to come back would be Wednesday, Feb 11 th.  I told the pt to call our office if he is still not feeling well next week.  Pt verbalized understanding.

## 2024-10-09 ENCOUNTER — Encounter (HOSPITAL_COMMUNITY)

## 2024-10-12 ENCOUNTER — Encounter (HOSPITAL_COMMUNITY)

## 2024-10-14 ENCOUNTER — Encounter (HOSPITAL_COMMUNITY)

## 2024-10-16 ENCOUNTER — Encounter (HOSPITAL_COMMUNITY)

## 2024-10-19 ENCOUNTER — Encounter (HOSPITAL_COMMUNITY)

## 2024-10-21 ENCOUNTER — Encounter (HOSPITAL_COMMUNITY)

## 2024-10-23 ENCOUNTER — Encounter (HOSPITAL_COMMUNITY)
